# Patient Record
Sex: Male | Born: 1970 | Race: White | Hispanic: No | Smoking: Never smoker
Health system: Southern US, Community
[De-identification: ages and names within clinical notes are randomized; demographics above are authoritative.]

## PROBLEM LIST (undated history)

## (undated) DIAGNOSIS — G4700 Insomnia, unspecified: Secondary | ICD-10-CM

## (undated) DIAGNOSIS — R519 Headache, unspecified: Secondary | ICD-10-CM

## (undated) DIAGNOSIS — F419 Anxiety disorder, unspecified: Secondary | ICD-10-CM

## (undated) DIAGNOSIS — G4733 Obstructive sleep apnea (adult) (pediatric): Secondary | ICD-10-CM

## (undated) DIAGNOSIS — E781 Pure hyperglyceridemia: Secondary | ICD-10-CM

## (undated) DIAGNOSIS — J329 Chronic sinusitis, unspecified: Secondary | ICD-10-CM

## (undated) DIAGNOSIS — M545 Low back pain, unspecified: Secondary | ICD-10-CM

## (undated) DIAGNOSIS — R51 Headache: Secondary | ICD-10-CM

## (undated) DIAGNOSIS — F3181 Bipolar II disorder: Secondary | ICD-10-CM

## (undated) DIAGNOSIS — J301 Allergic rhinitis due to pollen: Secondary | ICD-10-CM

## (undated) DIAGNOSIS — G8929 Other chronic pain: Secondary | ICD-10-CM

## (undated) DIAGNOSIS — Z87438 Personal history of other diseases of male genital organs: Secondary | ICD-10-CM

## (undated) DIAGNOSIS — E66811 Obesity, class 1: Secondary | ICD-10-CM

## (undated) DIAGNOSIS — K219 Gastro-esophageal reflux disease without esophagitis: Secondary | ICD-10-CM

## (undated) DIAGNOSIS — E669 Obesity, unspecified: Secondary | ICD-10-CM

## (undated) HISTORY — DX: Low back pain: M54.5

## (undated) HISTORY — DX: Obesity, unspecified: E66.9

## (undated) HISTORY — DX: Insomnia, unspecified: G47.00

## (undated) HISTORY — DX: Anxiety disorder, unspecified: F41.9

## (undated) HISTORY — DX: Headache: R51

## (undated) HISTORY — DX: Obstructive sleep apnea (adult) (pediatric): G47.33

## (undated) HISTORY — DX: Other chronic pain: G89.29

## (undated) HISTORY — DX: Chronic sinusitis, unspecified: J32.9

## (undated) HISTORY — DX: Gastro-esophageal reflux disease without esophagitis: K21.9

## (undated) HISTORY — DX: Headache, unspecified: R51.9

## (undated) HISTORY — DX: Pure hyperglyceridemia: E78.1

## (undated) HISTORY — DX: Obesity, class 1: E66.811

## (undated) HISTORY — DX: Allergic rhinitis due to pollen: J30.1

## (undated) HISTORY — DX: Bipolar II disorder: F31.81

## (undated) HISTORY — DX: Personal history of other diseases of male genital organs: Z87.438

## (undated) HISTORY — DX: Low back pain, unspecified: M54.50

---

## 2008-03-15 HISTORY — PX: OTHER SURGICAL HISTORY: SHX169

## 2008-07-13 DIAGNOSIS — G4733 Obstructive sleep apnea (adult) (pediatric): Secondary | ICD-10-CM

## 2008-07-13 HISTORY — DX: Obstructive sleep apnea (adult) (pediatric): G47.33

## 2008-12-26 ENCOUNTER — Inpatient Hospital Stay (HOSPITAL_COMMUNITY): Admission: AD | Admit: 2008-12-26 | Discharge: 2008-12-27 | Payer: Self-pay | Admitting: Orthopedic Surgery

## 2009-01-28 ENCOUNTER — Encounter: Admission: RE | Admit: 2009-01-28 | Discharge: 2009-02-26 | Payer: Self-pay | Admitting: Orthopedic Surgery

## 2010-06-18 LAB — DIFFERENTIAL
Basophils Absolute: 0 10*3/uL (ref 0.0–0.1)
Basophils Relative: 0 % (ref 0–1)
Eosinophils Absolute: 0.1 10*3/uL (ref 0.0–0.7)
Monocytes Absolute: 0.8 10*3/uL (ref 0.1–1.0)
Monocytes Relative: 8 % (ref 3–12)

## 2010-06-18 LAB — URINALYSIS, ROUTINE W REFLEX MICROSCOPIC
Bilirubin Urine: NEGATIVE
Glucose, UA: NEGATIVE mg/dL
Hgb urine dipstick: NEGATIVE
Protein, ur: NEGATIVE mg/dL
Urobilinogen, UA: 0.2 mg/dL (ref 0.0–1.0)
pH: 5.5 (ref 5.0–8.0)

## 2010-06-18 LAB — CBC
Hemoglobin: 15 g/dL (ref 13.0–17.0)
MCV: 84.7 fL (ref 78.0–100.0)
RBC: 5.22 MIL/uL (ref 4.22–5.81)
WBC: 10.3 10*3/uL (ref 4.0–10.5)

## 2010-06-18 LAB — COMPREHENSIVE METABOLIC PANEL
AST: 20 U/L (ref 0–37)
Alkaline Phosphatase: 55 U/L (ref 39–117)
Chloride: 106 mEq/L (ref 96–112)
Potassium: 3.4 mEq/L — ABNORMAL LOW (ref 3.5–5.1)
Sodium: 139 mEq/L (ref 135–145)
Total Bilirubin: 0.5 mg/dL (ref 0.3–1.2)
Total Protein: 7.7 g/dL (ref 6.0–8.3)

## 2010-06-18 LAB — PROTIME-INR: INR: 1.03 (ref 0.00–1.49)

## 2010-06-18 LAB — TYPE AND SCREEN: Antibody Screen: NEGATIVE

## 2010-09-22 IMAGING — CR DG OR PORTABLE SPINE
1 series · 1 of 1 positions shown · non-contrast
Comparison: Intraoperative study from 1727 hours the same day,
preoperative exam 12/24/2008.

CLINICAL DATA: 38-year-old male undergoing lumbar surgery.

PORTABLE SPINE

[view not recorded]
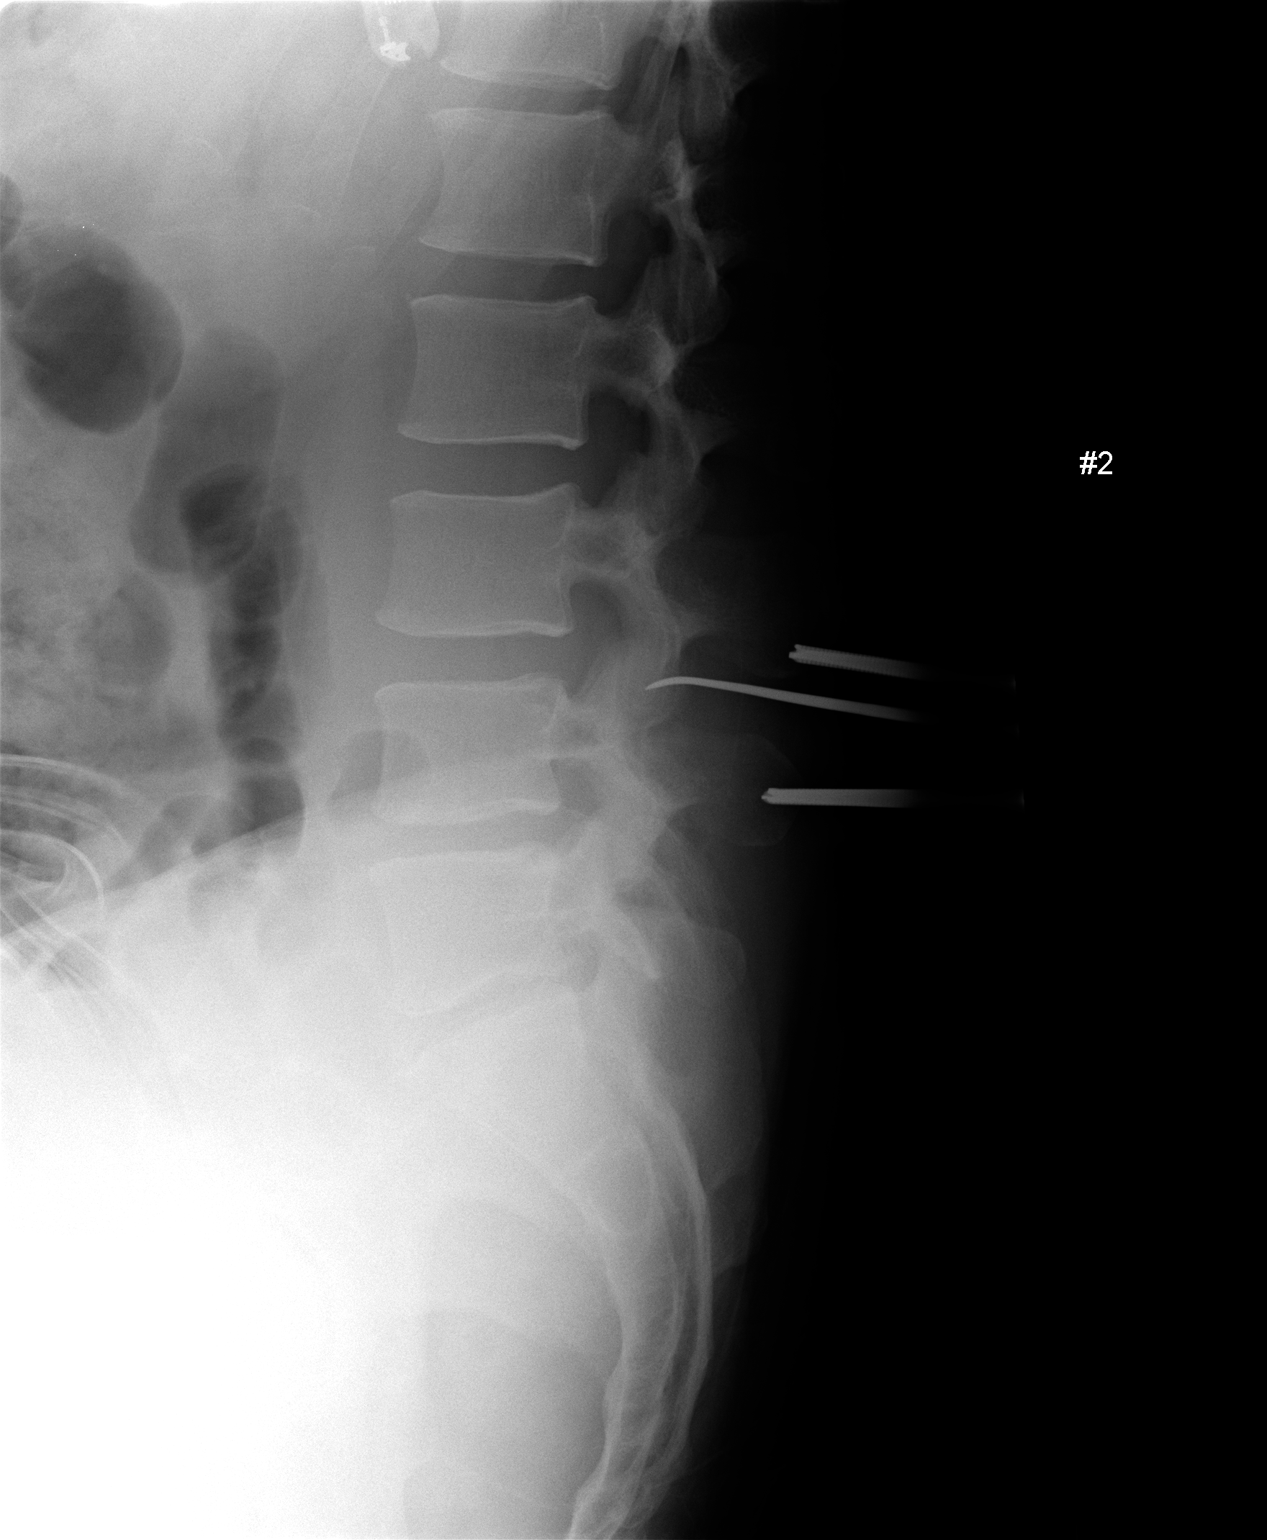

[1 of 1 positions shown; findings below may reference images not displayed]

FINDINGS: Portable cross-table lateral intraoperative view of the
lumbar spine labeled 5000 hours, #2.

Using the same numbering system as before (see report dated
12/24/2008), surgical probe is now present at the L3-L4 disc space
level, just above the L4 pedicle.  There are clamps on the L3 and
L4 spinous processes.
IMPRESSION: Intraoperative localization as above.  Transitional anatomy.
Correlation with appropriate cross sectional imaging is imperative.

## 2012-03-30 ENCOUNTER — Encounter: Payer: Self-pay | Admitting: Family Medicine

## 2012-03-30 ENCOUNTER — Ambulatory Visit (INDEPENDENT_AMBULATORY_CARE_PROVIDER_SITE_OTHER): Payer: BC Managed Care – PPO | Admitting: Family Medicine

## 2012-03-30 VITALS — BP 138/75 | HR 77 | Temp 98.8°F | Ht 71.0 in | Wt 213.0 lb

## 2012-03-30 DIAGNOSIS — G4700 Insomnia, unspecified: Secondary | ICD-10-CM

## 2012-03-30 MED ORDER — ZOLPIDEM TARTRATE 10 MG PO TABS: 10.0000 mg | ORAL_TABLET | Freq: Every evening | ORAL | Status: DC | PRN

## 2012-03-30 MED ORDER — TRAZODONE HCL 50 MG PO TABS: 25.0000 mg | ORAL_TABLET | Freq: Every evening | ORAL | Status: DC | PRN

## 2012-03-30 NOTE — Progress Notes (Signed)
Office Note 04/06/2012  CC:  Chief Complaint  Patient presents with  . Establish Care    hx sleep apnea; anxiety/depression under a lot of stress; sleep deprived for a decade; main issue is unable to fall asleep and stay asleep    HPI:  Alex Hall is a 42 y.o. White male who is here to establish care and discuss sleeping issues. Patient's most recent primary MD: last saw PMD at [facility] in [place] about 6 yrs ago. Old records were not reviewed prior to or during today's visit.  He describes a long history of anxiety and depression, insomnia, fatigue, sinus problems/pND, GERD w/esophagitis and also gastritis/duodenitis.  Has seen several specialists in the fields of ENT, allergy/immunology, psychiatry, and GI.  Today he wanted to discuss his insomnia, which he basically describes as intractable and resistant to any medication used in the past, including atypical antipsychotics.  He says he has not tried any rx med for it in about 5 yrs.  He adds that at first he did respond to zolpidem 10mg  but he eventually did not respond to it and d/c'd it, went on to try many others w/out success.  Past Medical History  Diagnosis Date  . Anxiety and depression   . Insomnia   . Chronic low back pain     with chronic radiculopathy pain  . OSA (obstructive sleep apnea)     Hx of intolerance to CPAP sedondary to anxiety/max intolerance  . Hay fever   . Chronic headaches   . Chronic sinusitis   . GERD (gastroesophageal reflux disease)     with schatski's ring    Past Surgical History  Procedure Date  . Lumbar back surg 2010    Family History  Problem Relation Age of Onset  . CVA Father   . Arthritis-Osteo Mother     History   Social History  . Marital Status: Married    Spouse Name: N/A    Number of Children: N/A  . Years of Education: N/A   Occupational History  . Not on file.   Social History Main Topics  . Smoking status: Never Smoker   . Smokeless tobacco: Never Used    . Alcohol Use: No  . Drug Use: No  . Sexually Active: Not on file   Other Topics Concern  . Not on file   Social History Narrative   Divorced, remarried.Works as an Leisure centre manager).College educated.Normal american diet.  No exercise.    Outpatient Encounter Prescriptions as of 03/30/2012  Medication Sig Dispense Refill  . aspirin-acetaminophen-caffeine (EXCEDRIN MIGRAINE) 250-250-65 MG per tablet Take 1 tablet by mouth every 6 (six) hours as needed.      . Naproxen Sodium (ALEVE) 220 MG CAPS Take 1 capsule by mouth as needed.      . traZODone (DESYREL) 50 MG tablet Take 0.5-1 tablets (25-50 mg total) by mouth at bedtime as needed for sleep.  30 tablet  1  . zolpidem (AMBIEN) 10 MG tablet Take 1 tablet (10 mg total) by mouth at bedtime as needed for sleep.  15 tablet  1    No Known Allergies  ROS Review of Systems  Constitutional: Positive for fatigue.  HENT: Positive for postnasal drip.   Cardiovascular: Chest pain: occasional.  Gastrointestinal: Negative for abdominal distention and anal bleeding.  Genitourinary: Negative for urgency and frequency.  Musculoskeletal: Positive for back pain.  Skin: Negative for rash.  Neurological: Negative for seizures.  Psychiatric/Behavioral: Positive for sleep  disturbance and dysphoric mood. Negative for suicidal ideas. The patient is nervous/anxious.     PE; Blood pressure 138/75, pulse 77, temperature 98.8 F (37.1 C), temperature source Temporal, height 5\' 11"  (1.803 m), weight 213 lb (96.616 kg), SpO2 97.00%. Gen: Alert, well appearing.  Patient is oriented to person, place, time, and situation. ENT: Ears: EACs clear, normal epithelium.  TMs with good light reflex and landmarks bilaterally.  Eyes: no injection, icteris, swelling, or exudate.  EOMI, PERRLA. Nose: no drainage or turbinate edema/swelling.  No injection or focal lesion.  Mouth: lips without lesion/swelling.  Oral mucosa pink  and moist.  Dentition intact and without obvious caries or gingival swelling.  Oropharynx without erythema, exudate, or swelling.  Neck - No masses or thyromegaly or limitation in range of motion CV: RRR, no m/r/g.   LUNGS: CTA bilat, nonlabored resps, good aeration in all lung fields. EXT: no clubbing, cyanosis, or edema.   Pertinent labs:  none  ASSESSMENT AND PLAN:   New pt: obtain old records (extensive!)  Insomnia Trial of ambien 10mg  again since he has done well on this in the past. Add trazodone 50mg  in 2d if no signif improvement.  Increase to 100mg  qhs after 3d if needed. Therapeutic expectations and side effect profile of medication discussed today.  Patient's questions answered.   An After Visit Summary was printed and given to the patient.  Spent 35 min with pt today, with >50% of that time spent in counseling and care coordination.  Return in about 1 week (around 04/06/2012) for morning appt-30 min spot--needs to be fasting.

## 2012-04-04 ENCOUNTER — Encounter: Payer: Self-pay | Admitting: Family Medicine

## 2012-04-04 DIAGNOSIS — G4700 Insomnia, unspecified: Secondary | ICD-10-CM | POA: Insufficient documentation

## 2012-04-04 NOTE — Assessment & Plan Note (Signed)
Trial of ambien 10mg  again since he has done well on this in the past. Add trazodone 50mg  in 2d if no signif improvement.  Increase to 100mg  qhs after 3d if needed. Therapeutic expectations and side effect profile of medication discussed today.  Patient's questions answered.

## 2012-04-06 ENCOUNTER — Encounter: Payer: Self-pay | Admitting: Family Medicine

## 2012-04-06 ENCOUNTER — Ambulatory Visit (INDEPENDENT_AMBULATORY_CARE_PROVIDER_SITE_OTHER): Payer: BC Managed Care – PPO | Admitting: Family Medicine

## 2012-04-06 VITALS — BP 132/78 | HR 82 | Temp 98.6°F | Ht 71.0 in | Wt 211.0 lb

## 2012-04-06 DIAGNOSIS — G4700 Insomnia, unspecified: Secondary | ICD-10-CM

## 2012-04-06 DIAGNOSIS — K2970 Gastritis, unspecified, without bleeding: Secondary | ICD-10-CM

## 2012-04-06 DIAGNOSIS — R635 Abnormal weight gain: Secondary | ICD-10-CM

## 2012-04-06 DIAGNOSIS — G4733 Obstructive sleep apnea (adult) (pediatric): Secondary | ICD-10-CM

## 2012-04-06 DIAGNOSIS — K2990 Gastroduodenitis, unspecified, without bleeding: Secondary | ICD-10-CM

## 2012-04-06 DIAGNOSIS — K2980 Duodenitis without bleeding: Secondary | ICD-10-CM

## 2012-04-06 LAB — CBC WITH DIFFERENTIAL/PLATELET
Basophils Absolute: 0 10*3/uL (ref 0.0–0.1)
Basophils Relative: 0.4 % (ref 0.0–3.0)
HCT: 44.3 % (ref 39.0–52.0)
Hemoglobin: 15 g/dL (ref 13.0–17.0)
Monocytes Absolute: 0.4 10*3/uL (ref 0.1–1.0)
Monocytes Relative: 6.3 % (ref 3.0–12.0)
Platelets: 243 10*3/uL (ref 150.0–400.0)
RBC: 5.3 Mil/uL (ref 4.22–5.81)
RDW: 14.3 % (ref 11.5–14.6)
WBC: 6.4 10*3/uL (ref 4.5–10.5)

## 2012-04-06 LAB — COMPREHENSIVE METABOLIC PANEL
ALT: 27 U/L (ref 0–53)
AST: 24 U/L (ref 0–37)
CO2: 28 mEq/L (ref 19–32)
Chloride: 103 mEq/L (ref 96–112)
Sodium: 138 mEq/L (ref 135–145)
Total Protein: 8.4 g/dL — ABNORMAL HIGH (ref 6.0–8.3)

## 2012-04-06 LAB — TSH: TSH: 1.45 u[IU]/mL (ref 0.35–5.50)

## 2012-04-06 LAB — SEDIMENTATION RATE: Sed Rate: 1 mm/hr (ref 0–22)

## 2012-04-06 LAB — LIPID PANEL
LDL Cholesterol: 82 mg/dL (ref 0–99)
Total CHOL/HDL Ratio: 4

## 2012-04-06 MED ORDER — AZELASTINE-FLUTICASONE 137-50 MCG/ACT NA SUSP: 1.0000 | Freq: Two times a day (BID) | NASAL | Status: DC

## 2012-04-06 MED ORDER — ALUMINUM CHLORIDE 20 % EX SOLN: Freq: Every day | CUTANEOUS | Status: DC

## 2012-04-06 NOTE — Assessment & Plan Note (Signed)
Minimal improvement. Continue to titrate trazodone up q4d to max of 250mg  qhs.  Continue zolpidem 10mg  qhs. His anxiety, which is untreated at this time, is having a huge impact on sleep.  Also, his OSA is likely inhibiting restful sleep. Will ask sleep med MD to see him to discuss his troubles with OSA and its treatments.

## 2012-04-06 NOTE — Progress Notes (Signed)
OFFICE NOTE  04/06/2012  CC:  Chief Complaint  Patient presents with  . Follow-up    insomnia: meds "kind of" helping; also needs fasting labs     HPI: Patient is a 42 y.o. Caucasian male who is here for 1 wk f/u insomnia. States mild improvement in sleep initiation initially, but has still had nights in which he got "1 hour" of sleep. He has added a trazodone 50mg  tab to his zolpidem.  He still says his mind races, anxiety plays havoc with him all day and night, also says he thinks his OSA is playing a big role.  Also says his post-nasal drip is constant and bothersome all night--currently not taking anything for this but is interested in retrying something. We discussed his past experience with allergy eval: two evaluations turned up several significant environmental allergens (here in [place]) and he was on oral drop immunotherapy for allergies for about 6-8 mo but d/c'd this due to lack of effect and high cost out of pocket.  Standard allergy medications were ineffective or intolerable.  So, essentially he is minimally changed but this is about what we expected given his past history, and we are both mildly pleased that he has at least tolerated the zolpidem and trazodone.  He asks about a med to help with his chronic severe axillary sweating related to his chronic anxiety. He still has plans to approach treatment for his anxiety and mood disorder from a non-medication standpoint since he has had NO success with any psych meds in the past.  He currently sees a psychologist.   Pertinent PMH:  Past Medical History  Diagnosis Date  . Anxiety and depression   . Insomnia   . Chronic low back pain     with chronic radiculopathy pain  . OSA (obstructive sleep apnea)     Hx of intolerance to CPAP sedondary to anxiety/max intolerance  . Hay fever   . Chronic headaches   . Chronic sinusitis   . GERD (gastroesophageal reflux disease)     with schatski's ring   Past surgical, social, and  family history reviewed and no changes noted since last office visit.  MEDS:  Outpatient Prescriptions Prior to Visit  Medication Sig Dispense Refill  . aspirin-acetaminophen-caffeine (EXCEDRIN MIGRAINE) 250-250-65 MG per tablet Take 1 tablet by mouth every 6 (six) hours as needed.      . Naproxen Sodium (ALEVE) 220 MG CAPS Take 1 capsule by mouth as needed.      . traZODone (DESYREL) 50 MG tablet Take 0.5-1 tablets (25-50 mg total) by mouth at bedtime as needed for sleep.  30 tablet  1  . zolpidem (AMBIEN) 10 MG tablet Take 1 tablet (10 mg total) by mouth at bedtime as needed for sleep.  15 tablet  1  Last reviewed on 04/06/2012  9:58 AM by Jeoffrey Massed, MD  PE: Blood pressure 132/78, pulse 82, temperature 98.6 F (37 C), temperature source Temporal, height 5\' 11"  (1.803 m), weight 211 lb (95.709 kg). Gen: Alert, well appearing.  Patient is oriented to person, place, time, and situation. AFFECT: pleasant, lucid thought and speech. No further exam today.  IMPRESSION AND PLAN: Insomnia Minimal improvement. Continue to titrate trazodone up q4d to max of 250mg  qhs.  Continue zolpidem 10mg  qhs. His anxiety, which is untreated at this time, is having a huge impact on sleep.  Also, his OSA is likely inhibiting restful sleep. Will ask sleep med MD to see him to discuss his troubles  with OSA and its treatments.     2) Excessive perspiration secondary to chronic high anxiety state: Drysol rx'd today.  Therapeutic expectations and side effect profile of medication discussed today.  Patient's questions answered.  3) Chronic post-nasal drip: trial of dymista rx'd.  Spent 25 min with pt today, with >50% of this time spent in counseling and care coordination for the above problems.  FOLLOW UP: 1 week

## 2012-04-09 LAB — GASTRIN: Gastrin: 22 pg/mL (ref <101)

## 2012-04-13 ENCOUNTER — Encounter: Payer: Self-pay | Admitting: Family Medicine

## 2012-04-13 ENCOUNTER — Ambulatory Visit (INDEPENDENT_AMBULATORY_CARE_PROVIDER_SITE_OTHER): Payer: BC Managed Care – PPO | Admitting: Family Medicine

## 2012-04-13 ENCOUNTER — Ambulatory Visit: Payer: BC Managed Care – PPO | Admitting: Family Medicine

## 2012-04-13 VITALS — BP 120/77 | HR 67 | Temp 99.0°F | Ht 71.0 in | Wt 211.0 lb

## 2012-04-13 DIAGNOSIS — G43009 Migraine without aura, not intractable, without status migrainosus: Secondary | ICD-10-CM

## 2012-04-13 DIAGNOSIS — G4700 Insomnia, unspecified: Secondary | ICD-10-CM

## 2012-04-13 MED ORDER — GABAPENTIN ENACARBIL ER 600 MG PO TBCR: 600.0000 mg | EXTENDED_RELEASE_TABLET | Freq: Every day | ORAL | Status: DC

## 2012-04-13 MED ORDER — RIZATRIPTAN BENZOATE 10 MG PO TABS: 10.0000 mg | ORAL_TABLET | ORAL | Status: DC | PRN

## 2012-04-13 NOTE — Assessment & Plan Note (Signed)
Continue zolpidem 10mg  qhs and trazodone 50mg  qhs. After we get his HA to resolve, I want him to start a trial of Horizant.  I think possibly his "nerve pain" is a condition similar to RLS that could be alleviated by this med.  He'll start at the recommended 600mg  dose at 5pm daily.  Therapeutic expectations and side effect profile of medication discussed today.  Patient's questions answered.

## 2012-04-13 NOTE — Assessment & Plan Note (Signed)
Maxalt 10mg  trial.  Therapeutic expectations and side effect profile of medication discussed today.  Patient's questions answered. I think in this case a fallback med in case this doesn't help is narcotic pain med such as tylox.  He reports having tolerated this type of med in the distant past when it was used for post-op back surgery.

## 2012-04-13 NOTE — Progress Notes (Signed)
OFFICE NOTE  04/13/2012  CC:  Chief Complaint  Patient presents with  . Follow-up    multiple issues     HPI: Patient is a 42 y.o. Caucasian male who is here for 1 wk f/u insomnia. Ambien 10mg  + trazodone 2 tabs was helping but in the same sentence patient describes nights of "no sleep" or "horrible sleep".  On one instance his wife got frightened b/c he seemed to be in a state in between wakefulness and sleep (at night, when trying to sleep).  Since this scared her, he has gone back to 1 trazodone and [place]. He definitely is still very anxious, is in the process of trying to get a less stressful position in his current company. When he tries to go to sleep, he definitely has racing nervous thoughts, also has constant "nerve pain" in his legs that he says bothers him but "I've grown used to it".  Denies restless legs/irresistable urge to move legs.  Basically feels sharp/shooting pains in all aspects of his legs.  Says he has had these pains ever since his low back surgery, which he describes as a horrible experience.  His most pressing problem today is a migraine HA for the last 4d.  Intensity is 6-7/10 and located in retroorbital and forehead area, +photophobia, nausea without vomiting.  He has 2-3 of these type of migraines per month, usually catches it early with an excedrin migraine and this helps significantly.  This one has not responded.  Even after he gets a couple hours of sleep the HA is still there.  No blurry vision or visual field deficit.  No fever, no neck stiffness, no focal weakness, no speech problems. No hx of ever having taken a triptan.       Pertinent PMH:  Past Medical History  Diagnosis Date  . Anxiety and depression   . Insomnia   . Chronic low back pain     with chronic radiculopathy pain  . OSA (obstructive sleep apnea)     Hx of intolerance to CPAP sedondary to anxiety/max intolerance  . Hay fever   . Chronic headaches   . Chronic sinusitis   .  GERD (gastroesophageal reflux disease)     with schatski's ring    MEDS:  Outpatient Prescriptions Prior to Visit  Medication Sig Dispense Refill  . aluminum chloride (DRYSOL) 20 % external solution Apply topically at bedtime.  60 mL  3  . aspirin-acetaminophen-caffeine (EXCEDRIN MIGRAINE) 250-250-65 MG per tablet Take 1 tablet by mouth every 6 (six) hours as needed.      . Naproxen Sodium (ALEVE) 220 MG CAPS Take 1 capsule by mouth as needed.      . traZODone (DESYREL) 50 MG tablet Take 0.5-1 tablets (25-50 mg total) by mouth at bedtime as needed for sleep.  30 tablet  1  . zolpidem (AMBIEN) 10 MG tablet Take 1 tablet (10 mg total) by mouth at bedtime as needed for sleep.  15 tablet  1  . Azelastine-Fluticasone (DYMISTA) 137-50 MCG/ACT SUSP Place 1 spray into the nose 2 (two) times daily.  23 g  3   Last reviewed on 04/13/2012  3:45 PM by Jeoffrey Massed, MD  PE: Blood pressure 120/77, pulse 67, temperature 99 F (37.2 C), temperature source Temporal, height 5\' 11"  (1.803 m), weight 211 lb (95.709 kg), SpO2 97.00%. Gen: Alert, well appearing.  Patient is oriented to person, place, time, and situation. AFFECT: pleasant, lucid thought and speech, slightly anxious. Neuro:  CN 2-12 intact bilaterally, strength 5/5 in proximal and distal upper extremities and lower extremities bilaterally.  No sensory deficits.  No tremor.  No disdiadochokinesis.  No ataxia.  Upper extremity and lower extremity DTRs symmetric.  No pronator drift.  IMPRESSION AND PLAN:  Insomnia Continue zolpidem 10mg  qhs and trazodone 50mg  qhs. After we get his HA to resolve, I want him to start a trial of Horizant.  I think possibly his "nerve pain" is a condition similar to RLS that could be alleviated by this med.  He'll start at the recommended 600mg  dose at 5pm daily.  Therapeutic expectations and side effect profile of medication discussed today.  Patient's questions answered.   Migraine headache without aura Maxalt  10mg  trial.  Therapeutic expectations and side effect profile of medication discussed today.  Patient's questions answered. I think in this case a fallback med in case this doesn't help is narcotic pain med such as tylox.  He reports having tolerated this type of med in the distant past when it was used for post-op back surgery.   I did bring up the subject of his past problems with chronic throat awareness and OSA.  In review of his ENT's notes from [facility] (Dr. Christell Constant), I told Jesusita Oka today that I think he should follow up with this MD to reopen the conversation about possible surgical intervention (tonsillectomy, palate surgery, etc).  An After Visit Summary was printed and given to the patient.  FOLLOW UP: 2 wks

## 2012-04-20 ENCOUNTER — Institutional Professional Consult (permissible substitution): Payer: BC Managed Care – PPO | Admitting: Pulmonary Disease

## 2012-04-27 ENCOUNTER — Other Ambulatory Visit: Payer: Self-pay | Admitting: Family Medicine

## 2012-04-28 ENCOUNTER — Ambulatory Visit: Payer: BC Managed Care – PPO | Admitting: Family Medicine

## 2012-05-01 NOTE — Telephone Encounter (Signed)
eScribe request for refill on ZOLPIDEM Last filled - 03/30/12, #15 X 1 Last seen on - 04/13/12 Follow up - 04/28/12--APPT canceled due to office closed (snow). Please advise refills.

## 2012-05-03 NOTE — Telephone Encounter (Signed)
RX faxed

## 2012-05-04 ENCOUNTER — Telehealth: Payer: Self-pay | Admitting: Family Medicine

## 2012-05-04 NOTE — Telephone Encounter (Signed)
Let me know when you have contacted Alex Hall.

## 2012-05-04 NOTE — Telephone Encounter (Signed)
29 minutes spent with insurance company, 3 transfers to different departments.  Dymista paperwork not received as [facility] is not doing there own PA's right now and [facility] did not get paperwork.  Dymista approved via phone. Zolpidem also approved via phone. John at [facility] was notified.  Herbert Seta has been notified that medications have been approved, but I do not know what copay will be.  She is thankful for our help.

## 2012-05-09 ENCOUNTER — Ambulatory Visit (INDEPENDENT_AMBULATORY_CARE_PROVIDER_SITE_OTHER): Payer: BC Managed Care – PPO | Admitting: Pulmonary Disease

## 2012-05-09 ENCOUNTER — Encounter: Payer: Self-pay | Admitting: Pulmonary Disease

## 2012-05-09 VITALS — BP 130/90 | HR 76 | Temp 98.4°F | Ht 71.0 in | Wt 217.0 lb

## 2012-05-09 DIAGNOSIS — G4733 Obstructive sleep apnea (adult) (pediatric): Secondary | ICD-10-CM

## 2012-05-09 DIAGNOSIS — G4700 Insomnia, unspecified: Secondary | ICD-10-CM

## 2012-05-09 NOTE — Patient Instructions (Addendum)
Degree of obstructive sleep apnea is mild -moderate Weight loss We can refer you to a dentist for oral appliance - Dr Myrtis Ser Treatment of insomnia as you are doing

## 2012-05-09 NOTE — Progress Notes (Signed)
Subjective:    Patient ID: Alex Hall, male    DOB: [birth year], 42 y.o.   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 He has long-standing anxiety and depression for 20 years and has seen multiple psychiatrists and undergone cognitive behavior therapy. He also suffers from chronic pain in his back after surgery. He reports sleep onset insomnia for at least last 10 years and has been started on Ambien and trazodone for the last 2-3 weeks. Obstructive sleep apnea was diagnosed no in 5/10, baseline polysomnogram showed total sleep time of 5 hours 48 minutes with AHI of 12 events per hour, predominant hypopneas, with an RDI of 21 events per hour and lowest desaturation of 87%. CPAP was titrated to 6 cm to correct these events .however he was unable to tolerate CPAP.  Epworth sleepiness score is 2/24. Bedtime is around 11 PM to midnight, sleep latency can be one to 2 hours. He attributes this to racing thoughts and anxiety symptoms. He sleeps on his right side with 2 pillows due to his chronic pain. He is 2-3 awakenings on the hour and is out of bed by 8 AM feeling miserable no but denies headaches or dryness of mouth. He admits to being a mouth breather. He is gained about 10 pounds in the last 2 years and denies excessive consumption of caffeinated beverages. There is no history suggestive of cataplexy, sleep paralysis or parasomnias   Past Medical History  Diagnosis Date  . Anxiety and depression   . Insomnia   . Chronic low back pain     with chronic radiculopathy pain  . OSA (obstructive sleep apnea)     Hx of intolerance to CPAP sedondary to anxiety/max intolerance  . Hay fever   . Chronic headaches   . Chronic sinusitis   . GERD (gastroesophageal reflux disease)     with schatski's ring    Past Surgical History  Procedure Laterality Date  . Lumbar back surg  2010    Allergies  Allergen Reactions   . Effexor (Venlafaxine Hcl) Other (See Comments)    "bad side effects"  . Risperdal (Risperidone) Other (See Comments)    "ineffective + "side effects"  . Stelazine (Trifluoperazine) Other (See Comments)    "terrible, awful side effects"  . Zyprexa (Olanzapine) Other (See Comments)    "terrible, awful side effects"    History   Social History  . Marital Status: Married    Spouse Name: N/A    Number of Children: N/A  . Years of Education: N/A   Occupational History  . Acupuncturist    Social History Main Topics  . Smoking status: Never Smoker   . Smokeless tobacco: Never Used  . Alcohol Use: No  . Drug Use: No  . Sexually Active: Not on file   Other Topics Concern  . Not on file   Social History Narrative   Divorced, remarried.   Works as an Leisure centre manager).   College educated.   Normal american diet.  No exercise.      Review of Systems  Constitutional: Negative for fever and unexpected weight change.  HENT: Negative for ear pain, nosebleeds, congestion, sore throat, rhinorrhea, sneezing, trouble swallowing, dental problem, postnasal drip and sinus pressure.   Eyes: Negative for redness and itching.  Respiratory: Negative for cough, chest tightness, shortness of breath and wheezing.   Cardiovascular: Negative for palpitations and leg swelling.  Gastrointestinal:  Negative for nausea and vomiting.  Genitourinary: Negative for dysuria.  Musculoskeletal: Negative for joint swelling.  Skin: Negative for rash.  Neurological: Positive for headaches.  Hematological: Does not bruise/bleed easily.  Psychiatric/Behavioral: Positive for dysphoric mood. The patient is nervous/anxious.        Objective:   Physical Exam  Gen. Pleasant, well-nourished, in no distress, normal affect ENT - no lesions, no post nasal drip Neck: No JVD, no thyromegaly, no carotid bruits Lungs: no use of accessory muscles, no dullness  to percussion, clear without rales or rhonchi  Cardiovascular: Rhythm regular, heart sounds  normal, no murmurs or gallops, no peripheral edema Abdomen: soft and non-tender, no hepatosplenomegaly, BS normal. Musculoskeletal: No deformities, no cyanosis or clubbing Neuro:  alert, non focal       Assessment & Plan:

## 2012-05-09 NOTE — Assessment & Plan Note (Addendum)
Degree of obstructive sleep apnea is mild -moderate -He did not tolerate CPAP at all  Weight loss 10 lbs We can refer you to a dentist for oral appliance - Dr Myrtis Ser

## 2012-05-11 NOTE — Assessment & Plan Note (Signed)
Would suggest addition of melatonin 5 mg Trazodone is at a small dose and can be increased further if needed. Would prefer single agent therapy no.

## 2012-05-18 ENCOUNTER — Encounter: Payer: Self-pay | Admitting: Family Medicine

## 2012-05-25 ENCOUNTER — Encounter: Payer: Self-pay | Admitting: Pulmonary Disease

## 2012-06-17 ENCOUNTER — Other Ambulatory Visit: Payer: Self-pay | Admitting: Family Medicine

## 2012-06-20 NOTE — Telephone Encounter (Signed)
eScribe request for refill on TRAZODONE Last filled - 03/30/12, #30 X 1 (1/2 - 1 QHS) Last seen on - 04/13/12 Follow up - 2 WEEKS (cancelled due to snow) Please advise refills.

## 2012-06-27 ENCOUNTER — Telehealth: Payer: Self-pay | Admitting: Family Medicine

## 2012-06-27 MED ORDER — ZOLPIDEM TARTRATE 10 MG PO TABS: ORAL_TABLET | ORAL | Status: DC

## 2012-06-27 NOTE — Telephone Encounter (Signed)
Refill request for Ambien 10 mg Last filled- 05/03/12 #30 1/refill Last seen- 04/13/12  Follow up - 2 weeks  Please advise refills.

## 2012-06-27 NOTE — Telephone Encounter (Signed)
Prescription faxed to pharmacy.

## 2012-06-27 NOTE — Telephone Encounter (Signed)
OK to rf as previously prescribed, with 5 additional RFs.-thx

## 2012-08-01 ENCOUNTER — Telehealth: Payer: Self-pay | Admitting: Family Medicine

## 2012-08-01 MED ORDER — CLONAZEPAM 1 MG PO TABS: ORAL_TABLET | ORAL | Status: DC

## 2012-08-01 NOTE — Telephone Encounter (Signed)
Talked to Dr. Ledon Snare, clinical neuropsychologist in [place], who has been seeing pt lately. He is referring pt to neurobiofeedback and he also suggests a trial of clonazepam (this is one of the few psych meds pt has not been on, and it can be very helpful in severe anxiety if dose is pushed some--even in people like this pt who have not responded to other benzodiazepines--per Dr. Dewitt Hoes input today). I agreed to start this rx.  Dr. Ledon Snare assured me that he'll be following Alex Hall closely in his office and will send me periodic office notes.  Jasmine December: will you pls fax the clonazepam rx I printed to pt's pharmacy ([facility]).-thx

## 2012-08-01 NOTE — Telephone Encounter (Signed)
Rx faxed/[facility]

## 2012-08-29 ENCOUNTER — Telehealth: Payer: Self-pay | Admitting: Family Medicine

## 2012-08-29 MED ORDER — LAMOTRIGINE 100 MG PO TABS: ORAL_TABLET | ORAL | Status: DC

## 2012-08-29 NOTE — Telephone Encounter (Signed)
Patients wife aware, she will have him pick up medication and make an appointment to f/u with Dr Milinda Cave in a month.

## 2012-08-29 NOTE — Telephone Encounter (Signed)
Lisa: please call pt and tell him I have eRx'd lamictal for him, as per the conversation I had with his psychologist noted below.  Tell him I would like him to f/u with me in office in 4-5 wks.-thx  Received call from pt's psychologist today, Dr. Ledon Snare. Due to pt's recent paradoxic response to klonopin trial recently (and with help of hx from pt's wife), Dr. Ledon Snare now believes the pt's dx is bipolar II, and that most of his anxiety sx's are actually hypomanic sx's.  He recommended starting lamictal 50mg  qd and titrating up to 200mg  qd.  If depressive sx's not improved on this over the next 4-6 wks, will add fluoxetine 20mg  daily, goal of 40mg  daily dosing. Will eRx lamictal and have pt come in for o/v in about 4-5 wks.

## 2012-08-30 ENCOUNTER — Telehealth: Payer: Self-pay | Admitting: Family Medicine

## 2012-08-30 MED ORDER — TRAZODONE HCL 50 MG PO TABS: ORAL_TABLET | ORAL | Status: DC

## 2012-08-30 MED ORDER — ZOLPIDEM TARTRATE 10 MG PO TABS: ORAL_TABLET | ORAL | Status: DC

## 2012-08-30 NOTE — Telephone Encounter (Signed)
Caller informed, understood & states she will update counselor on providers recommendation on fluoxetine; Rx scripts faxed to pharmacy/[facility]

## 2012-08-30 NOTE — Telephone Encounter (Signed)
Patient met with counselor. Counselor recommended patient take Prozac with the Lamictal. Please advise.

## 2012-08-30 NOTE — Telephone Encounter (Signed)
Please advise new medication and refills.

## 2012-08-30 NOTE — Telephone Encounter (Signed)
Ambien and trazodone may be RF'd as previously prescribed with 5 additional RFs each. Regarding the new meds, I have already sent eRx for his lamictal and I am not starting fluoxetine/prozac until after he has been on lamictal for at least a month.-thx

## 2012-09-05 ENCOUNTER — Other Ambulatory Visit: Payer: Self-pay | Admitting: Family Medicine

## 2012-09-27 ENCOUNTER — Encounter: Payer: Self-pay | Admitting: Family Medicine

## 2012-09-27 ENCOUNTER — Ambulatory Visit (INDEPENDENT_AMBULATORY_CARE_PROVIDER_SITE_OTHER): Payer: BC Managed Care – PPO | Admitting: Family Medicine

## 2012-09-27 VITALS — BP 127/74 | HR 60 | Temp 98.1°F | Resp 16 | Ht 71.0 in | Wt 201.0 lb

## 2012-09-27 DIAGNOSIS — F3189 Other bipolar disorder: Secondary | ICD-10-CM

## 2012-09-27 DIAGNOSIS — G4700 Insomnia, unspecified: Secondary | ICD-10-CM

## 2012-09-27 DIAGNOSIS — F3181 Bipolar II disorder: Secondary | ICD-10-CM

## 2012-09-27 MED ORDER — TRAZODONE HCL 50 MG PO TABS: ORAL_TABLET | ORAL | Status: DC

## 2012-09-27 MED ORDER — AZELASTINE-FLUTICASONE 137-50 MCG/ACT NA SUSP: 2.0000 | Freq: Two times a day (BID) | NASAL | Status: DC

## 2012-09-27 MED ORDER — LAMOTRIGINE 200 MG PO TABS: 200.0000 mg | ORAL_TABLET | Freq: Every day | ORAL | Status: DC

## 2012-09-27 MED ORDER — ZOLPIDEM TARTRATE 10 MG PO TABS: ORAL_TABLET | ORAL | Status: DC

## 2012-09-27 NOTE — Assessment & Plan Note (Addendum)
Tolerating lamictal 150mg  qd but not feeling any improvement in psychiatric state yet. Plan is to continue the 150mg  qd dosing 1 more week and then increase to 200mg  once daily at that time. The plan had been to add fluoxetine to this med once we felt like some mood stabilization has occurred--we're obviously not at that point yet. He is happy that he has at least tolerated the lamictal w/out side effect and he is optimistic about his chances of improvement on this med in the near future. Reviewed side effect profile of lamictal with pt again--particulary what to do if he develops a rash.

## 2012-09-27 NOTE — Patient Instructions (Signed)
Take 150mg  lamictal daily for 1 more week, then increase to 200mg  daily (new rx sent in). Change ambien to 1/2 of 10mg  tab nightly and increase trazodone to 100mg  at bedtime.

## 2012-09-27 NOTE — Assessment & Plan Note (Signed)
Will increase trazodone to 100mg  qhs and decrease ambien to 1/2 of a 10mg  tab qhs. Side effects of trazodone discussed.

## 2012-09-27 NOTE — Progress Notes (Signed)
OFFICE NOTE  09/27/2012  CC:  Chief Complaint  Patient presents with  . Follow-up  . Insomnia     HPI: Patient is a 42 y.o. Caucasian male who is here for f/u psych med regimen. Started lamictal per recommendations of his psychologist and he has made it up to the 150mg  dosing for at about 1 wk now (dx of bipolar II disorder based on past response to antidepressants and other psychotropic meds, but most impressively from his manic reaction to taking clonazepam a couple of months ago. He denies any rash or any other side effect from the med, which he is happy about.  However, he notes no change in his level of irritability, racing thoughts, moodiness, sleep dysfunction, and anxiety. He is definitely willing to continue the med and increase to max dose of 200mg  soon and give things more time.  Regarding insomnia, he gets only fair sleep on 10mg  ambien and 50mg  trazodone qhs, says he doesn't like being reliant on [place] b/c it is a controlled substance. He asks about possibly increasing the trazodone (denies side effects) and decreasing the ambien.  He reports that he has a new job which is less stressful. He says the Drysol has helped with his hyperhydration and dymista has helped with his rhinitis/PND.  Pertinent PMH:  Past Medical History  Diagnosis Date  . Anxiety and depression   . Insomnia   . Chronic low back pain     with chronic radiculopathy pain  . OSA (obstructive sleep apnea) 07/2008    Hx of intolerance to CPAP sedondary to anxiety/max intolerance  . Hay fever   . Chronic headaches   . Chronic sinusitis   . GERD (gastroesophageal reflux disease)     with schatski's ring    MEDS:  Outpatient Prescriptions Prior to Visit  Medication Sig Dispense Refill  . aluminum chloride (DRYSOL) 20 % external solution Apply topically at bedtime.  60 mL  3  . aspirin-acetaminophen-caffeine (EXCEDRIN MIGRAINE) 250-250-65 MG per tablet Take 1 tablet by mouth every 6 (six) hours as  needed.      . Azelastine-Fluticasone (DYMISTA) 137-50 MCG/ACT SUSP Place 1 spray into the nose 2 (two) times daily.  23 g  3  . cetirizine (ZYRTEC) 10 MG tablet Take 10 mg by mouth daily.      Marland Kitchen lamoTRIgine (LAMICTAL) 100 MG tablet 1/2 tab po qd x 2 wks, then increase to 1 whole tab po qd x 2 wks, then increase to 1 and 1/2 tabs po qd  30 tablet  1  . Naproxen Sodium (ALEVE) 220 MG CAPS Take 1 capsule by mouth as needed.      . rizatriptan (MAXALT) 10 MG tablet Take 1 tablet (10 mg total) by mouth as needed for migraine. May repeat in 2 hours if needed  9 tablet  0  . traZODone (DESYREL) 50 MG tablet TAKE 0.5-1 TABLETS (25-50 MG TOTAL) BY MOUTH AT BEDTIME AS NEEDED FOR SLEEP.  30 tablet  5  . zolpidem (AMBIEN) 10 MG tablet TAKE 1 TABLET BY MOUTH AT BEDTIME  30 tablet  5  . Gabapentin Enacarbil (HORIZANT) 600 MG TB24 Take 600 mg by mouth at bedtime. (take at 5pm every day)  30 tablet  1   No facility-administered medications prior to visit.    PE: Blood pressure 127/74, pulse 60, temperature 98.1 F (36.7 C), temperature source Oral, resp. rate 16, height 5\' 11"  (1.803 m), weight 201 lb (91.173 kg), SpO2 98.00%. Gen: Alert, well  appearing.  Patient is oriented to person, place, time, and situation. AFFECT: pleasant, lucid thought and speech. No further exam today.  IMPRESSION AND PLAN:  Bipolar II disorder Tolerating lamictal 150mg  qd but not feeling any improvement in psychiatric state yet. Plan is to continue the 150mg  qd dosing 1 more week and then increase to 200mg  once daily at that time. The plan had been to add fluoxetine to this med once we felt like some mood stabilization has occurred--we're obviously not at that point yet. He is happy that he has at least tolerated the lamictal w/out side effect and he is optimistic about his chances of improvement on this med in the near future. Reviewed side effect profile of lamictal with pt again--particulary what to do if he develops a  rash.  Insomnia Will increase trazodone to 100mg  qhs and decrease ambien to 1/2 of a 10mg  tab qhs. Side effects of trazodone discussed.   An After Visit Summary was printed and given to the patient.  FOLLOW UP: 6 wks

## 2012-10-17 ENCOUNTER — Telehealth: Payer: Self-pay | Admitting: Family Medicine

## 2012-10-17 MED ORDER — FLUOXETINE HCL 20 MG PO TABS: ORAL_TABLET | ORAL | Status: DC

## 2012-10-17 NOTE — Telephone Encounter (Signed)
Dr. Ledon Snare (pt's psychologist) called to update me: pt doing well on lamictal 200mg  w/out side effects and they want to start prozac. Plan is to start at 20mg  and titrate to 40mg  qd. Dr. Ledon Snare will see him weekly and update me as needed.

## 2012-11-08 ENCOUNTER — Encounter: Payer: Self-pay | Admitting: Family Medicine

## 2012-11-08 ENCOUNTER — Ambulatory Visit (INDEPENDENT_AMBULATORY_CARE_PROVIDER_SITE_OTHER): Payer: BC Managed Care – PPO | Admitting: Family Medicine

## 2012-11-08 VITALS — BP 117/74 | HR 63 | Temp 98.8°F | Resp 16 | Ht 71.0 in | Wt 203.0 lb

## 2012-11-08 DIAGNOSIS — Z79899 Other long term (current) drug therapy: Secondary | ICD-10-CM

## 2012-11-08 DIAGNOSIS — G4700 Insomnia, unspecified: Secondary | ICD-10-CM

## 2012-11-08 DIAGNOSIS — G4733 Obstructive sleep apnea (adult) (pediatric): Secondary | ICD-10-CM

## 2012-11-08 DIAGNOSIS — F3189 Other bipolar disorder: Secondary | ICD-10-CM

## 2012-11-08 DIAGNOSIS — F3181 Bipolar II disorder: Secondary | ICD-10-CM

## 2012-11-08 MED ORDER — LAMOTRIGINE 100 MG PO TABS: ORAL_TABLET | ORAL | Status: DC

## 2012-11-08 NOTE — Assessment & Plan Note (Addendum)
No signif change in mood.   He is currently in depressed phase. Discussed need to give lamictal and fluoxetine more time.   Will also push dose of lamictal to 100 mg qAM and 200 mg qhs.  Off label dosing discussed and patient is fine with this.   He is fully aware of possible side effects.  Check BMET today. He'll continue to see his psychologist q 2wks.  Of note, he did say he recalls tolerating depakote fine in the past, so if we need to add this to his lamictal int the future he says he would be open to this.

## 2012-11-08 NOTE — Progress Notes (Signed)
OFFICE NOTE  11/08/2012  CC:  Chief Complaint  Patient presents with  . Follow-up     HPI: Patient is a 41 y.o. Caucasian male who is here for 6 wk f/u bipolar d/o and anxiety. Seeing Dr. Ledon Snare (psychologist) q2 wks. Still feels no change in mood since getting on lamictal, but also still feels no adverse effects from the med.   No rash.  Depressed mood, negative self talk, poor energy level are consistent complaints.  Denies SI or HI. Anxiety also still significantly affecting him. Sleep impairment continues: currently on 100 mg traz and 5mg  ambien. Tolerating prozac ---now at the 20mg  bid dosing--has been back on this for approx 3 wks.  Of note, pt reports that a recent sleep study via Dr. Vassie Loll showed mild/mod OSA and he has seen an orthodontist about getting an oral appliance made for him since he does not tolerate CPAP.  This process looks like it may take 6-8 wks but he's hopeful it will be tolerable and help.  Pertinent PMH:  Past Medical History  Diagnosis Date  . Bipolar II disorder   . Insomnia   . Chronic low back pain     with chronic radiculopathy pain  . OSA (obstructive sleep apnea) 07/2008    Hx of intolerance to CPAP sedondary to anxiety/max intolerance  . Hay fever   . Chronic headaches   . Chronic sinusitis   . GERD (gastroesophageal reflux disease)     with schatski's ring  . Anxiety    Past surgical, social, and family history reviewed and no changes noted since last office visit.  MEDS:  Outpatient Prescriptions Prior to Visit  Medication Sig Dispense Refill  . aluminum chloride (DRYSOL) 20 % external solution Apply topically at bedtime.  60 mL  3  . aspirin-acetaminophen-caffeine (EXCEDRIN MIGRAINE) 250-250-65 MG per tablet Take 1 tablet by mouth every 6 (six) hours as needed.      . Azelastine-Fluticasone (DYMISTA) 137-50 MCG/ACT SUSP Place 2 sprays into the nose 2 (two) times daily.  23 g  6  . cetirizine (ZYRTEC) 10 MG tablet Take 10 mg by mouth  daily.      Marland Kitchen FLUoxetine (PROZAC) 20 MG tablet 2 tabs po qd  60 tablet  3  . fluticasone (FLONASE) 50 MCG/ACT nasal spray       . Naproxen Sodium (ALEVE) 220 MG CAPS Take 1 capsule by mouth as needed.      . rizatriptan (MAXALT) 10 MG tablet Take 1 tablet (10 mg total) by mouth as needed for migraine. May repeat in 2 hours if needed  9 tablet  0  . traZODone (DESYREL) 50 MG tablet TAKE 1-2 TABLETS BY MOUTH AT BEDTIME AS NEEDED FOR SLEEP.  60 tablet  5  . zolpidem (AMBIEN) 10 MG tablet TAKE 1/2-1 TABLET BY MOUTH AT BEDTIME  30 tablet  5  . lamoTRIgine (LAMICTAL) 200 MG tablet Take 1 tablet (200 mg total) by mouth daily.  30 tablet  1  . Gabapentin Enacarbil (HORIZANT) 600 MG TB24 Take 600 mg by mouth at bedtime. (take at 5pm every day)  30 tablet  1   No facility-administered medications prior to visit.    PE: Blood pressure 117/74, pulse 63, temperature 98.8 F (37.1 C), temperature source Temporal, resp. rate 16, height 5\' 11"  (1.803 m), weight 203 lb (92.08 kg), SpO2 97.00%. Gen: Alert, well appearing.  Patient is oriented to person, place, time, and situation. ENT: Eyes: no injection, icteris, swelling, or exudate.  EOMI, PERRLA. Nose: no drainage or turbinate edema/swelling.  No injection or focal lesion.  Mouth: lips without lesion/swelling.  Oral mucosa pink and moist.  Dentition intact and without obvious caries or gingival swelling.  Oropharynx without erythema, exudate, or swelling.  Neck - No masses or thyromegaly or limitation in range of motion CV: RRR, no m/r/g.   LUNGS: CTA bilat, nonlabored resps, good aeration in all lung fields. EXT: no clubbing, cyanosis, or edema.   LAB: none today  IMPRESSION AND PLAN:  Bipolar II disorder No signif change in mood.   He is currently in depressed phase. Discussed need to give lamictal and fluoxetine more time.   Will also push dose of lamictal to 100 mg qAM and 200 mg qhs.  Off label dosing discussed and patient is fine with this.    He is fully aware of possible side effects.  Check BMET today. He'll continue to see his psychologist q 2wks.  Of note, he did say he recalls tolerating depakote fine in the past, so if we need to add this to his lamictal int the future he says he would be open to this.  Insomnia He'll continue to tinker with the trazodone and ambien dosing a bit.  OSA (obstructive sleep apnea) Getting oral appliance through his orthodontist---hopefully within the next 2 months.   An After Visit Summary was printed and given to the patient.  FOLLOW UP:  2 mo

## 2012-11-08 NOTE — Assessment & Plan Note (Signed)
Getting oral appliance through his orthodontist---hopefully within the next 2 months.

## 2012-11-08 NOTE — Assessment & Plan Note (Signed)
He'll continue to tinker with the trazodone and ambien dosing a bit.

## 2012-11-09 LAB — BASIC METABOLIC PANEL
BUN: 19 mg/dL (ref 6–23)
Glucose, Bld: 85 mg/dL (ref 70–99)
Potassium: 4.9 mEq/L (ref 3.5–5.3)

## 2012-12-20 ENCOUNTER — Other Ambulatory Visit: Payer: Self-pay | Admitting: Family Medicine

## 2012-12-20 ENCOUNTER — Telehealth: Payer: Self-pay | Admitting: Family Medicine

## 2012-12-20 MED ORDER — FLUOXETINE HCL 20 MG PO TABS: ORAL_TABLET | ORAL | Status: DC

## 2012-12-20 MED ORDER — LAMOTRIGINE 100 MG PO TABS: ORAL_TABLET | ORAL | Status: DC

## 2012-12-20 NOTE — Telephone Encounter (Signed)
Dr. Ledon Snare, pt's psychologist, called with an update: says pt has progressed very nicely on lamictal and prozac and therapy--"he is at a level of 7/10 regarding how well he's doing"! Pt had tremulousness on 300 mg qd dose of lamictal so he backed down to 200mg  qd dose and has no side effects.  He currently has no side effects from fluoxetine 40mg  daily. Dr. Ledon Snare asked if I would be ok with increasing pt's fluoxetine to 60 mg qd and I said this would be fine: he'll to 50mg  qd x 7d and then increase to 60mg  qd. New rx sent to pharmacy today.

## 2013-01-08 ENCOUNTER — Encounter: Payer: Self-pay | Admitting: Family Medicine

## 2013-01-08 ENCOUNTER — Ambulatory Visit (INDEPENDENT_AMBULATORY_CARE_PROVIDER_SITE_OTHER): Payer: BC Managed Care – PPO | Admitting: Family Medicine

## 2013-01-08 VITALS — BP 126/75 | HR 74 | Temp 97.8°F | Ht 71.0 in | Wt 209.5 lb

## 2013-01-08 DIAGNOSIS — F3189 Other bipolar disorder: Secondary | ICD-10-CM

## 2013-01-08 DIAGNOSIS — F3181 Bipolar II disorder: Secondary | ICD-10-CM

## 2013-01-08 DIAGNOSIS — Z23 Encounter for immunization: Secondary | ICD-10-CM

## 2013-01-08 DIAGNOSIS — G4700 Insomnia, unspecified: Secondary | ICD-10-CM

## 2013-01-08 MED ORDER — TETANUS-DIPHTH-ACELL PERTUSSIS 5-2.5-18.5 LF-MCG/0.5 IM SUSP: 0.5000 mL | Freq: Once | INTRAMUSCULAR | Status: DC

## 2013-01-08 NOTE — Progress Notes (Signed)
OFFICE NOTE  01/08/2013  CC:  Chief Complaint  Patient presents with  . Follow-up     HPI: Patient is a 42 y.o. Caucasian male who is here for 2 mo f/u mood disorder (most likely bipolar II) and insomnia. Recently, after discussion with pt's psychologist, we pushed his fluoxetine dose to 60mg  qd.  He has tolerated lamictal 200 mg qd but a push to 300 mg qd was intolerable (tremulous) for him so he went back to 200mg  qd.  Thea Silversmith 1-2 tabs and sometimes 1/2 ambien to sleep.  He rates his overall mood and sleep as "6/10".  Pertinent PMH:  Past Medical History  Diagnosis Date  . Bipolar II disorder   . Insomnia   . Chronic low back pain     with chronic radiculopathy pain  . OSA (obstructive sleep apnea) 07/2008    Hx of intolerance to CPAP sedondary to anxiety/max intolerance  . Hay fever   . Chronic headaches   . Chronic sinusitis   . GERD (gastroesophageal reflux disease)     with schatski's ring  . Anxiety    Past surgical, social, and family history reviewed and no changes noted since last office visit.  MEDS: Not taking Horizant as listed below Outpatient Prescriptions Prior to Visit  Medication Sig Dispense Refill  . aluminum chloride (DRYSOL) 20 % external solution Apply topically at bedtime.  60 mL  3  . aspirin-acetaminophen-caffeine (EXCEDRIN MIGRAINE) 250-250-65 MG per tablet Take 1 tablet by mouth every 6 (six) hours as needed.      . Azelastine-Fluticasone (DYMISTA) 137-50 MCG/ACT SUSP Place 2 sprays into the nose 2 (two) times daily.  23 g  6  . cetirizine (ZYRTEC) 10 MG tablet Take 10 mg by mouth daily.      Marland Kitchen FLUoxetine (PROZAC) 20 MG tablet 3 tabs po qd  90 tablet  3  . fluticasone (FLONASE) 50 MCG/ACT nasal spray       . lamoTRIgine (LAMICTAL) 100 MG tablet 1 tab po bid  60 tablet  3  . Naproxen Sodium (ALEVE) 220 MG CAPS Take 1 capsule by mouth as needed.      . rizatriptan (MAXALT) 10 MG tablet Take 1 tablet (10 mg total) by mouth as needed for migraine.  May repeat in 2 hours if needed  9 tablet  0  . traZODone (DESYREL) 50 MG tablet TAKE 1-2 TABLETS BY MOUTH AT BEDTIME AS NEEDED FOR SLEEP.  60 tablet  5  . zolpidem (AMBIEN) 10 MG tablet TAKE 1/2-1 TABLET BY MOUTH AT BEDTIME  30 tablet  5  . Gabapentin Enacarbil (HORIZANT) 600 MG TB24 Take 600 mg by mouth at bedtime. (take at 5pm every day)  30 tablet  1   No facility-administered medications prior to visit.    PE: Blood pressure 126/75, pulse 74, temperature 97.8 F (36.6 C), temperature source Oral, height 5\' 11"  (1.803 m), weight 209 lb 8 oz (95.029 kg), SpO2 97.00%. Wt Readings from Last 2 Encounters:  01/08/13 209 lb 8 oz (95.029 kg)  11/08/12 203 lb (92.08 kg)    Gen: alert, oriented x 4, affect pleasant.  Lucid thinking and conversation noted. HEENT: PERRLA, EOMI.   Neck: no LAD, mass, or thyromegaly. CV: RRR, no m/r/g LUNGS: CTA bilat, nonlabored. NEURO: no tremor or tics noted on observation.  Coordination intact. CN 2-12 grossly intact bilaterally, strength 5/5 in all extremeties.  No ataxia.   IMPRESSION AND PLAN:  1) Bipolar II, improving pretty nicely on current  med regimen. Continue meds, continue close f/u with psychologist for ongoing therapy. His insomnia is pretty well controlled.  No changes at this time.  Tdap booster given today. He is already UTd on flu vaccine this season.  FOLLOW UP: 4 mo

## 2013-01-08 NOTE — Addendum Note (Signed)
Addended by: Marlene Lard on: 01/08/2013 08:36 AM   Modules accepted: Orders

## 2013-05-09 ENCOUNTER — Other Ambulatory Visit: Payer: Self-pay | Admitting: Family Medicine

## 2013-05-09 ENCOUNTER — Telehealth: Payer: Self-pay | Admitting: Family Medicine

## 2013-05-09 MED ORDER — TRAZODONE HCL 50 MG PO TABS: ORAL_TABLET | ORAL | Status: DC

## 2013-05-09 MED ORDER — LAMOTRIGINE 100 MG PO TABS: ORAL_TABLET | ORAL | Status: DC

## 2013-05-09 MED ORDER — FLUOXETINE HCL 20 MG PO TABS: ORAL_TABLET | ORAL | Status: DC

## 2013-05-09 MED ORDER — ZOLPIDEM TARTRATE 10 MG PO TABS: ORAL_TABLET | ORAL | Status: DC

## 2013-05-09 NOTE — Telephone Encounter (Signed)
I sent in trazadone, prozac, and lamictal.  Last seen 01/08/13. Last rx was 09/27/12 x 5 refills.  Please advise if we can send in Rx for ambien for patient.

## 2013-05-09 NOTE — Telephone Encounter (Signed)
Ambien rx printed and can be faxed to his pharmacy.-thx

## 2013-05-09 NOTE — Telephone Encounter (Signed)
Patient has an appt tomorrow however the weather may not cooperate. Patient is almost out of meds. Can an Rx be sent in today?

## 2013-05-10 ENCOUNTER — Ambulatory Visit: Payer: BC Managed Care – PPO | Admitting: Family Medicine

## 2013-05-11 ENCOUNTER — Encounter: Payer: Self-pay | Admitting: Family Medicine

## 2013-05-11 ENCOUNTER — Ambulatory Visit (INDEPENDENT_AMBULATORY_CARE_PROVIDER_SITE_OTHER): Payer: BC Managed Care – PPO | Admitting: Family Medicine

## 2013-05-11 VITALS — BP 113/82 | HR 69 | Temp 98.0°F | Resp 18 | Ht 71.0 in | Wt 211.0 lb

## 2013-05-11 DIAGNOSIS — F411 Generalized anxiety disorder: Secondary | ICD-10-CM | POA: Insufficient documentation

## 2013-05-11 DIAGNOSIS — F3189 Other bipolar disorder: Secondary | ICD-10-CM

## 2013-05-11 DIAGNOSIS — F3181 Bipolar II disorder: Secondary | ICD-10-CM

## 2013-05-11 DIAGNOSIS — G4700 Insomnia, unspecified: Secondary | ICD-10-CM

## 2013-05-11 DIAGNOSIS — T887XXA Unspecified adverse effect of drug or medicament, initial encounter: Secondary | ICD-10-CM

## 2013-05-11 NOTE — Progress Notes (Signed)
Pre visit review using our clinic review tool, if applicable. No additional management support is needed unless otherwise documented below in the visit note. 

## 2013-05-11 NOTE — Progress Notes (Signed)
OFFICE NOTE  05/11/2013  CC: f/u mood d/o, insomnia, anxiety   HPI: Patient is a 43 y.o. Caucasian male who is here for 4 mo f/u bipolar II, insomnia, and anxiety. Describes being on a bit of a downturn regarding mood lately.  He had decreased his lamictal dosing to 100mg  qd due to tremulous UE's and he immediately felt a depressed mood.  He then increased his dose back to 200mg  even though his tremulous hands had improved some on the 100mg  dosing. He notes the tremors getting a bit worse again but his depression began to lift coincidental with increase in lamictal back to 200 mg qd.   Describes some urinary frequency and poor stream ever since getting on high dose prozac.  This has been stable over time.   Sleep is decent/stable taking 1/2 of 10mg  zolpidem and 2 trazodone qhs.  He still sees Dr. Ledon Snare, his psychologist, every other week and still finds this very helpful.  ROS: tiny little bumps noted about a month ago on medial forearms, medial thighs.  No hives, no ulcers, no pustules or vesicles.  No oral mucosal lesions.  Pertinent PMH:  Past medical, surgical, social, and family history reviewed and no changes are noted since last office visit.  MEDS:  Outpatient Prescriptions Prior to Visit  Medication Sig Dispense Refill  . aluminum chloride (DRYSOL) 20 % external solution Apply topically at bedtime.  60 mL  3  . aspirin-acetaminophen-caffeine (EXCEDRIN MIGRAINE) 250-250-65 MG per tablet Take 1 tablet by mouth every 6 (six) hours as needed.      . Azelastine-Fluticasone (DYMISTA) 137-50 MCG/ACT SUSP Place 2 sprays into the nose 2 (two) times daily.  23 g  6  . cetirizine (ZYRTEC) 10 MG tablet Take 10 mg by mouth daily.      Marland Kitchen FLUoxetine (PROZAC) 20 MG tablet 3 tabs po qd  90 tablet  3  . lamoTRIgine (LAMICTAL) 100 MG tablet 1 tab po bid  60 tablet  3  . Naproxen Sodium (ALEVE) 220 MG CAPS Take 1 capsule by mouth as needed.      . rizatriptan (MAXALT) 10 MG tablet Take 1 tablet  (10 mg total) by mouth as needed for migraine. May repeat in 2 hours if needed  9 tablet  0  . traZODone (DESYREL) 50 MG tablet TAKE 1-2 TABLETS BY MOUTH AT BEDTIME AS NEEDED FOR SLEEP.  60 tablet  3  . zolpidem (AMBIEN) 10 MG tablet TAKE 1/2-1 TABLET BY MOUTH AT BEDTIME  30 tablet  5  . fluticasone (FLONASE) 50 MCG/ACT nasal spray        Facility-Administered Medications Prior to Visit  Medication Dose Route Frequency Provider Last Rate Last Dose  . TDaP (BOOSTRIX) injection 0.5 mL  0.5 mL Intramuscular Once Jeoffrey Massed, MD        PE: Blood pressure 113/82, pulse 69, temperature 98 F (36.7 C), temperature source Temporal, resp. rate 18, height 5\' 11"  (1.803 m), weight 211 lb (95.709 kg), SpO2 98.00%. Wt Readings from Last 2 Encounters:  05/11/13 211 lb (95.709 kg)  01/08/13 209 lb 8 oz (95.029 kg)   Gen: alert, oriented x 4, affect pleasant.  Lucid thinking and conversation noted. HEENT: PERRLA, EOMI.  Lips normal.  Tongue and mouth/throat w/out lesion. Neck: no LAD, mass, or thyromegaly. CV: RRR, no m/r/g LUNGS: CTA bilat, nonlabored. NEURO: mild tremulousness bilat with FNF.  Minimal tremor with holding hands outstretched in front of him.   No tics noted on  observation.  Coordination intact. DTRs 3+ patella bilat, 2+ achilles bilat, 1 beat clonus bilat.  2+ UE DTRs bilat. CN 2-12 grossly intact bilaterally, strength 5/5 in all extremeties.  No ataxia. SKIN: some scattered pinkish color to base of hair follicles on both medial thigh regions. No vesicles, no pustules, no urticarial lesions.   IMPRESSION AND PLAN:  1) Bipolar II disorder: slight depression lately but overall stable and no need for med adjustment. Continue 200 mg qd lamictal.  Recent skin changes not worrisome but pt to keep monitoring.  2) Anxiety: stable.  3) Insomnia: stable.  Continue current meds.  FOLLOW UP:  4 mo

## 2013-09-06 ENCOUNTER — Encounter: Payer: Self-pay | Admitting: Family Medicine

## 2013-09-06 ENCOUNTER — Ambulatory Visit (INDEPENDENT_AMBULATORY_CARE_PROVIDER_SITE_OTHER): Payer: BC Managed Care – PPO | Admitting: Family Medicine

## 2013-09-06 VITALS — BP 107/72 | HR 69 | Temp 98.3°F | Resp 18 | Ht 71.0 in | Wt 216.0 lb

## 2013-09-06 DIAGNOSIS — F411 Generalized anxiety disorder: Secondary | ICD-10-CM

## 2013-09-06 DIAGNOSIS — F3189 Other bipolar disorder: Secondary | ICD-10-CM

## 2013-09-06 DIAGNOSIS — J301 Allergic rhinitis due to pollen: Secondary | ICD-10-CM

## 2013-09-06 DIAGNOSIS — R21 Rash and other nonspecific skin eruption: Secondary | ICD-10-CM | POA: Insufficient documentation

## 2013-09-06 DIAGNOSIS — F3181 Bipolar II disorder: Secondary | ICD-10-CM

## 2013-09-06 DIAGNOSIS — J309 Allergic rhinitis, unspecified: Secondary | ICD-10-CM | POA: Insufficient documentation

## 2013-09-06 DIAGNOSIS — G4700 Insomnia, unspecified: Secondary | ICD-10-CM

## 2013-09-06 MED ORDER — FLUOXETINE HCL 20 MG PO TABS: ORAL_TABLET | ORAL | Status: DC

## 2013-09-06 MED ORDER — AZELASTINE-FLUTICASONE 137-50 MCG/ACT NA SUSP: 2.0000 | Freq: Two times a day (BID) | NASAL | Status: DC

## 2013-09-06 MED ORDER — TRAZODONE HCL 50 MG PO TABS: ORAL_TABLET | ORAL | Status: DC

## 2013-09-06 NOTE — Progress Notes (Signed)
OFFICE NOTE  09/06/2013  CC:  Chief Complaint  Patient presents with  . Follow-up    4 months   HPI: Patient is a 43 y.o. Caucasian male who is here for f/u mood d/o, anxiety, insomnia. Doing pretty well. Sleep is okay still, not great. Still with occ rash consisting of little bumps on arms and legs that come and go.  No vesicles or pustules or hives. It itches some but otherwise he doesn't feel any systemic sx's.  Usually worse in heat. Hand tremors still at minimal level. Still seeing Dr. Nolen MuMcKinney every 2 wks. Allergies just a touch worse lately: he feels like the zyrtec (and dymista) are not as effective lately. Needs RFs of prozac, trazodone.  Pertinent PMH:  Past medical, surgical, social, and family history reviewed and no changes are noted since last office visit.  MEDS:  Outpatient Prescriptions Prior to Visit  Medication Sig Dispense Refill  . aluminum chloride (DRYSOL) 20 % external solution Apply topically at bedtime.  60 mL  3  . aspirin-acetaminophen-caffeine (EXCEDRIN MIGRAINE) 250-250-65 MG per tablet Take 1 tablet by mouth every 6 (six) hours as needed.      . Azelastine-Fluticasone (DYMISTA) 137-50 MCG/ACT SUSP Place 2 sprays into the nose 2 (two) times daily.  23 g  6  . cetirizine (ZYRTEC) 10 MG tablet Take 10 mg by mouth daily.      Marland Kitchen. FLUoxetine (PROZAC) 20 MG tablet 3 tabs po qd  90 tablet  3  . lamoTRIgine (LAMICTAL) 100 MG tablet 1 tab po bid  60 tablet  3  . Naproxen Sodium (ALEVE) 220 MG CAPS Take 1 capsule by mouth as needed.      . rizatriptan (MAXALT) 10 MG tablet Take 1 tablet (10 mg total) by mouth as needed for migraine. May repeat in 2 hours if needed  9 tablet  0  . traZODone (DESYREL) 50 MG tablet TAKE 1-2 TABLETS BY MOUTH AT BEDTIME AS NEEDED FOR SLEEP.  60 tablet  3  . zolpidem (AMBIEN) 10 MG tablet TAKE 1/2-1 TABLET BY MOUTH AT BEDTIME  30 tablet  5   Facility-Administered Medications Prior to Visit  Medication Dose Route Frequency Provider  Last Rate Last Dose  . TDaP (BOOSTRIX) injection 0.5 mL  0.5 mL Intramuscular Once Jeoffrey MassedPhilip H McGowen, MD        PE: Blood pressure 107/72, pulse 69, temperature 98.3 F (36.8 C), temperature source Temporal, resp. rate 18, height 5\' 11"  (1.803 m), weight 216 lb (97.977 kg), SpO2 97.00%. Gen: Alert, well appearing.  Patient is oriented to person, place, time, and situation. AFFECT: pleasant, lucid thought and speech. SKIN: no rash currently. No further exam today.  IMPRESSION AND PLAN:  1) bipolar d/o: stable on lamictal and prozac.  Continue psychologist f/u--this has been very helpful for pt.  2) Anxiety d/o: stable on prozac.  3) Rash: benign, possibly related to his lamictal. Signs/symptoms to call or return for were reviewed and pt expressed understanding.  4) Insomnia.  The current medical regimen is effective;  continue present plan and medications.  5) Allergic rhinitis: stay on dymista and ROTATE nonsedating antihistamines [id number] since his zyrtec seems to not be helping much lately.  An After Visit Summary was printed and given to the patient.  FOLLOW UP: 62mo for CPE w/fasting labs

## 2013-09-06 NOTE — Progress Notes (Signed)
Pre visit review using our clinic review tool, if applicable. No additional management support is needed unless otherwise documented below in the visit note. 

## 2013-10-31 ENCOUNTER — Other Ambulatory Visit: Payer: Self-pay | Admitting: Family Medicine

## 2013-12-10 ENCOUNTER — Telehealth: Payer: Self-pay

## 2013-12-10 MED ORDER — ZOLPIDEM TARTRATE 10 MG PO TABS: ORAL_TABLET | ORAL | Status: DC

## 2013-12-10 NOTE — Telephone Encounter (Signed)
rx request for zolpidem tartrate 10 mg tabs qty 30 w/ 5 rfs.  Rx done and faxed into pharmacy

## 2013-12-11 ENCOUNTER — Other Ambulatory Visit: Payer: Self-pay | Admitting: Family Medicine

## 2013-12-11 NOTE — Telephone Encounter (Signed)
Duplicate request. Please disregard.

## 2013-12-20 ENCOUNTER — Other Ambulatory Visit: Payer: Self-pay | Admitting: Family Medicine

## 2013-12-20 NOTE — Telephone Encounter (Signed)
Received RF request for trazadone through [facility] but [facility] states pt just picked up 90 day supply 12/17/13.  Faxed RX request back to [facility] for them to get RX transferred.

## 2014-02-19 ENCOUNTER — Encounter: Payer: Self-pay | Admitting: Family Medicine

## 2014-02-19 ENCOUNTER — Ambulatory Visit (INDEPENDENT_AMBULATORY_CARE_PROVIDER_SITE_OTHER): Payer: BC Managed Care – PPO | Admitting: Family Medicine

## 2014-02-19 VITALS — BP 125/78 | HR 75 | Temp 98.4°F | Resp 18 | Ht 71.0 in | Wt 226.0 lb

## 2014-02-19 DIAGNOSIS — Z0000 Encounter for general adult medical examination without abnormal findings: Secondary | ICD-10-CM

## 2014-02-19 LAB — CBC WITH DIFFERENTIAL/PLATELET
BASOS PCT: 0.4 % (ref 0.0–3.0)
Basophils Absolute: 0 10*3/uL (ref 0.0–0.1)
EOS ABS: 0.1 10*3/uL (ref 0.0–0.7)
EOS PCT: 2.5 % (ref 0.0–5.0)
HEMATOCRIT: 46.6 % (ref 39.0–52.0)
Hemoglobin: 15.3 g/dL (ref 13.0–17.0)
LYMPHS ABS: 1.6 10*3/uL (ref 0.7–4.0)
Lymphocytes Relative: 27.2 % (ref 12.0–46.0)
MCHC: 32.9 g/dL (ref 30.0–36.0)
MCV: 85.5 fl (ref 78.0–100.0)
MONO ABS: 0.3 10*3/uL (ref 0.1–1.0)
Monocytes Relative: 4.9 % (ref 3.0–12.0)
NEUTROS PCT: 65 % (ref 43.0–77.0)
Neutro Abs: 3.8 10*3/uL (ref 1.4–7.7)
PLATELETS: 234 10*3/uL (ref 150.0–400.0)
RBC: 5.45 Mil/uL (ref 4.22–5.81)
RDW: 14.6 % (ref 11.5–15.5)
WBC: 5.8 10*3/uL (ref 4.0–10.5)

## 2014-02-19 LAB — COMPREHENSIVE METABOLIC PANEL
ALK PHOS: 55 U/L (ref 39–117)
ALT: 26 U/L (ref 0–53)
AST: 26 U/L (ref 0–37)
Albumin: 4.6 g/dL (ref 3.5–5.2)
BUN: 21 mg/dL (ref 6–23)
CO2: 29 mEq/L (ref 19–32)
Calcium: 9.8 mg/dL (ref 8.4–10.5)
Chloride: 106 mEq/L (ref 96–112)
Creatinine, Ser: 1.3 mg/dL (ref 0.4–1.5)
GFR: 67.12 mL/min (ref >60.00)
Glucose, Bld: 88 mg/dL (ref 70–99)
POTASSIUM: 4.7 meq/L (ref 3.5–5.1)
Sodium: 140 mEq/L (ref 135–145)
Total Bilirubin: 0.4 mg/dL (ref 0.2–1.2)
Total Protein: 7.5 g/dL (ref 6.0–8.3)

## 2014-02-19 LAB — LDL CHOLESTEROL, DIRECT: Direct LDL: 111.6 mg/dL

## 2014-02-19 LAB — LIPID PANEL
CHOL/HDL RATIO: 5
CHOLESTEROL: 198 mg/dL (ref 0–200)
HDL: 38.9 mg/dL — AB (ref >39.00)
NONHDL: 159.1
Triglycerides: 225 mg/dL — ABNORMAL HIGH (ref 0.0–149.0)
VLDL: 45 mg/dL — ABNORMAL HIGH (ref 0.0–40.0)

## 2014-02-19 LAB — TSH: TSH: 1.6 u[IU]/mL (ref 0.35–4.50)

## 2014-02-19 MED ORDER — ZOLPIDEM TARTRATE 10 MG PO TABS: ORAL_TABLET | ORAL | Status: DC

## 2014-02-19 MED ORDER — FLUOXETINE HCL 20 MG PO TABS: ORAL_TABLET | ORAL | Status: DC

## 2014-02-19 MED ORDER — LAMOTRIGINE 100 MG PO TABS: 100.0000 mg | ORAL_TABLET | Freq: Two times a day (BID) | ORAL | Status: DC

## 2014-02-19 MED ORDER — AZELASTINE-FLUTICASONE 137-50 MCG/ACT NA SUSP: 2.0000 | Freq: Two times a day (BID) | NASAL | Status: DC

## 2014-02-19 MED ORDER — ALBUTEROL SULFATE HFA 108 (90 BASE) MCG/ACT IN AERS: 2.0000 | INHALATION_SPRAY | Freq: Four times a day (QID) | RESPIRATORY_TRACT | Status: DC | PRN

## 2014-02-19 MED ORDER — TRAZODONE HCL 50 MG PO TABS: ORAL_TABLET | ORAL | Status: DC

## 2014-02-19 NOTE — Progress Notes (Signed)
Office Note 02/19/2014  CC:  Chief Complaint  Patient presents with  . Annual Exam    fasting   HPI:  Alex Hall is a 43 y.o. White male who is here for CPE, fasting.  Notes some wheezing/SOB lately, sometimes spontaneously when lying down (not SOB just an odd pop sound when taking deep breaths) or other times with exercise (usually after exercising a while, chest tight/wheeze, goes away with 5 min of rest).   Mentally, doing "fair".  Getting counseling still.  Sounds like he is having some maritial issues of late, sounds like he doesn't really want to expand on this today.   Past Medical History  Diagnosis Date  . Bipolar II disorder   . Insomnia   . Chronic low back pain     with chronic radiculopathy pain  . OSA (obstructive sleep apnea) 07/2008    Hx of intolerance to CPAP sedondary to anxiety/max intolerance  . Hay fever   . Chronic headaches   . Chronic sinusitis   . GERD (gastroesophageal reflux disease)     with schatski's ring  . Anxiety     Past Surgical History  Procedure Laterality Date  . Lumbar back surg  2010    Family History  Problem Relation Age of Onset  . CVA Father   . Arthritis-Osteo Mother     History   Social History  . Marital Status: Married    Spouse Name: N/A    Number of Children: N/A  . Years of Education: N/A   Occupational History  . Acupuncturistelectrical engineer    Social History Main Topics  . Smoking status: Never Smoker   . Smokeless tobacco: Never Used  . Alcohol Use: No  . Drug Use: No  . Sexual Activity: Not on file   Other Topics Concern  . Not on file   Social History Narrative   Divorced, remarried.   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   College educated.   Normal american diet.  No exercise.    Outpatient Prescriptions Prior to Visit  Medication Sig Dispense Refill  . aluminum chloride (DRYSOL) 20 % external solution Apply topically at bedtime. 60 mL 3  .  aspirin-acetaminophen-caffeine (EXCEDRIN MIGRAINE) 250-250-65 MG per tablet Take 1 tablet by mouth every 6 (six) hours as needed.    . Azelastine-Fluticasone (DYMISTA) 137-50 MCG/ACT SUSP Place 2 sprays into the nose 2 (two) times daily. 3 Bottle 3  . cetirizine (ZYRTEC) 10 MG tablet Take 10 mg by mouth daily.    Marland Kitchen. FLUoxetine (PROZAC) 20 MG tablet 3 tabs po qd 270 tablet 3  . lamoTRIgine (LAMICTAL) 100 MG tablet TAKE 1 TABLET BY MOUTH TWICE A DAY 60 tablet 3  . Naproxen Sodium (ALEVE) 220 MG CAPS Take 1 capsule by mouth as needed.    . traZODone (DESYREL) 50 MG tablet TAKE 1-2 TABLETS BY MOUTH AT BEDTIME AS NEEDED FOR SLEEP. 180 tablet 3  . rizatriptan (MAXALT) 10 MG tablet Take 1 tablet (10 mg total) by mouth as needed for migraine. May repeat in 2 hours if needed (Patient not taking: Reported on 02/19/2014) 9 tablet 0  . zolpidem (AMBIEN) 10 MG tablet TAKE 1/2-1 TABLET BY MOUTH AT BEDTIME (Patient not taking: Reported on 02/19/2014) 30 tablet 5   Facility-Administered Medications Prior to Visit  Medication Dose Route Frequency Provider Last Rate Last Dose  . TDaP (BOOSTRIX) injection 0.5 mL  0.5 mL Intramuscular Once Jeoffrey MassedPhilip H Sheddrick Lattanzio, MD  Allergies  Allergen Reactions  . Clonazepam Other (See Comments)    manic  . Effexor [Venlafaxine Hcl] Other (See Comments)    "bad side effects"  . Risperdal [Risperidone] Other (See Comments)    "ineffective + "side effects"  . Stelazine [Trifluoperazine] Other (See Comments)    "terrible, awful side effects"  . Zyprexa [Olanzapine] Other (See Comments)    "terrible, awful side effects"    ROS Review of Systems  Constitutional: Negative for fever, chills, appetite change and fatigue.  HENT: Negative for congestion, dental problem, ear pain and sore throat.   Eyes: Negative for discharge, redness and visual disturbance.  Respiratory: Positive for chest tightness (as per HPI) and wheezing (as per HPI). Negative for cough and shortness of  breath.   Cardiovascular: Negative for chest pain, palpitations and leg swelling.  Gastrointestinal: Negative for nausea, vomiting, abdominal pain, diarrhea and blood in stool.  Genitourinary: Negative for dysuria, urgency, frequency, hematuria, flank pain and difficulty urinating.  Musculoskeletal: Negative for myalgias, back pain, joint swelling, arthralgias and neck stiffness.  Skin: Negative for pallor and rash.  Neurological: Negative for dizziness, speech difficulty, weakness and headaches.  Hematological: Negative for adenopathy. Does not bruise/bleed easily.  Psychiatric/Behavioral: Negative for confusion and sleep disturbance. The patient is not nervous/anxious.     PE; Blood pressure 125/78, pulse 75, temperature 98.4 F (36.9 C), temperature source Temporal, resp. rate 18, height 5\' 11"  (1.803 m), weight 226 lb (102.513 kg), SpO2 94 %. Gen: Alert, well appearing.  Patient is oriented to person, place, time, and situation. AFFECT: pleasant, lucid thought and speech. ENT: Ears: EACs clear, normal epithelium.  TMs with good light reflex and landmarks bilaterally.  Eyes: no injection, icteris, swelling, or exudate.  EOMI, PERRLA. Nose: no drainage or turbinate edema/swelling.  No injection or focal lesion.  Mouth: lips without lesion/swelling.  Oral mucosa pink and moist.  Dentition intact and without obvious caries or gingival swelling.  Oropharynx without erythema, exudate, or swelling.  Neck: supple/nontender.  No LAD, mass, or TM.  Carotid pulses 2+ bilaterally, without bruits. CV: RRR, no m/r/g.   LUNGS: CTA bilat, nonlabored resps, good aeration in all lung fields. ABD: soft, NT, ND, BS normal.  No hepatospenomegaly or mass.  No bruits. EXT: no clubbing, cyanosis, or edema.  Musculoskeletal: no joint swelling, erythema, warmth, or tenderness.  ROM of all joints intact. Skin - no sores or suspicious lesions or rashes or color changes  Pertinent labs:  None today  ASSESSMENT  AND PLAN:   Health maintenance examination Reviewed age and gender appropriate health maintenance issues (prudent diet, regular exercise, health risks of tobacco and excessive alcohol, use of seatbelts, fire alarms in home, use of sunscreen).  Also reviewed age and gender appropriate health screening as well as vaccine recommendations. He is UTD on vaccines. HP labs today. Trial of ProAir HFA for exercise induced bronchospasm sx's he's been describing. Continue all other chronic meds as-is. Continue psych counseling.   An After Visit Summary was printed and given to the patient.  FOLLOW UP:  Return in about 6 months (around 08/21/2014) for routine chronic illness f/u.

## 2014-02-19 NOTE — Progress Notes (Signed)
Pre visit review using our clinic review tool, if applicable. No additional management support is needed unless otherwise documented below in the visit note. 

## 2014-02-19 NOTE — Assessment & Plan Note (Signed)
Reviewed age and gender appropriate health maintenance issues (prudent diet, regular exercise, health risks of tobacco and excessive alcohol, use of seatbelts, fire alarms in home, use of sunscreen).  Also reviewed age and gender appropriate health screening as well as vaccine recommendations. He is UTD on vaccines. HP labs today. Trial of ProAir HFA for exercise induced bronchospasm sx's he's been describing. Continue all other chronic meds as-is. Continue psych counseling.

## 2014-02-21 ENCOUNTER — Encounter: Payer: Self-pay | Admitting: Family Medicine

## 2014-04-29 ENCOUNTER — Ambulatory Visit: Payer: Self-pay | Admitting: Family Medicine

## 2014-05-01 ENCOUNTER — Ambulatory Visit (INDEPENDENT_AMBULATORY_CARE_PROVIDER_SITE_OTHER): Payer: BLUE CROSS/BLUE SHIELD | Admitting: Family Medicine

## 2014-05-01 ENCOUNTER — Encounter: Payer: Self-pay | Admitting: Family Medicine

## 2014-05-01 VITALS — BP 118/75 | HR 73 | Temp 98.3°F | Resp 18 | Ht 71.0 in | Wt 225.0 lb

## 2014-05-01 DIAGNOSIS — N138 Other obstructive and reflux uropathy: Secondary | ICD-10-CM

## 2014-05-01 DIAGNOSIS — R300 Dysuria: Secondary | ICD-10-CM

## 2014-05-01 DIAGNOSIS — N401 Enlarged prostate with lower urinary tract symptoms: Secondary | ICD-10-CM

## 2014-05-01 LAB — POCT URINALYSIS DIPSTICK
BILIRUBIN UA: NEGATIVE
Blood, UA: NEGATIVE
Glucose, UA: NEGATIVE
KETONES UA: NEGATIVE
LEUKOCYTES UA: NEGATIVE
Nitrite, UA: NEGATIVE
PH UA: 6
Protein, UA: NEGATIVE
Spec Grav, UA: >=1.03
Urobilinogen, UA: 0.2

## 2014-05-01 MED ORDER — TAMSULOSIN HCL 0.4 MG PO CAPS: 0.4000 mg | ORAL_CAPSULE | Freq: Every day | ORAL | Status: DC

## 2014-05-01 MED ORDER — SULFAMETHOXAZOLE-TRIMETHOPRIM 800-160 MG PO TABS: 1.0000 | ORAL_TABLET | Freq: Two times a day (BID) | ORAL | Status: DC

## 2014-05-01 NOTE — Progress Notes (Signed)
Pre visit review using our clinic review tool, if applicable. No additional management support is needed unless otherwise documented below in the visit note. 

## 2014-05-01 NOTE — Progress Notes (Signed)
OFFICE NOTE  05/01/2014  CC:  Chief Complaint  Patient presents with  . Urinary Frequency    x 1 year, gotten worse in the past two months  . Dysuria   HPI: Patient is a 44 y.o. Caucasian male who is here for urinary complaints. For over a year now has felt difficulty in initiating urination, has weak stream, daytime frequency>nocturia, some discomfort when urinating and right after.  Urine is light yellow color usually.  No blood in urine.   Caffein intake doesn't seem to affect it.  Has hx of prostatitis a couple of times in the past and recalls sx's being worse and feeling nauseated and "very sick".  "Massive increase" in stress over the last 2 mo.  Pertinent PMH:  Past medical, surgical, social, and family history reviewed and no changes are noted since last office visit.  MEDS:  Outpatient Prescriptions Prior to Visit  Medication Sig Dispense Refill  . albuterol (PROAIR HFA) 108 (90 BASE) MCG/ACT inhaler Inhale 2 puffs into the lungs every 6 (six) hours as needed for wheezing or shortness of breath. 1 Inhaler 1  . aluminum chloride (DRYSOL) 20 % external solution Apply topically at bedtime. 60 mL 3  . aspirin-acetaminophen-caffeine (EXCEDRIN MIGRAINE) 250-250-65 MG per tablet Take 1 tablet by mouth every 6 (six) hours as needed.    . Azelastine-Fluticasone (DYMISTA) 137-50 MCG/ACT SUSP Place 2 sprays into the nose 2 (two) times daily. 3 Bottle 3  . cetirizine (ZYRTEC) 10 MG tablet Take 10 mg by mouth daily.    Marland Kitchen. FLUoxetine (PROZAC) 20 MG tablet 3 tabs po qd 270 tablet 3  . lamoTRIgine (LAMICTAL) 100 MG tablet Take 1 tablet (100 mg total) by mouth 2 (two) times daily. 180 tablet 3  . Naproxen Sodium (ALEVE) 220 MG CAPS Take 1 capsule by mouth as needed.    . traZODone (DESYREL) 50 MG tablet TAKE 1-2 TABLETS BY MOUTH AT BEDTIME AS NEEDED FOR SLEEP. 180 tablet 3  . zolpidem (AMBIEN) 10 MG tablet TAKE 1/2-1 TABLET BY MOUTH AT BEDTIME 30 tablet 5  . rizatriptan (MAXALT) 10 MG tablet  Take 1 tablet (10 mg total) by mouth as needed for migraine. May repeat in 2 hours if needed (Patient not taking: Reported on 05/01/2014) 9 tablet 0   Facility-Administered Medications Prior to Visit  Medication Dose Route Frequency Provider Last Rate Last Dose  . TDaP (BOOSTRIX) injection 0.5 mL  0.5 mL Intramuscular Once Jeoffrey MassedPhilip H Davyn Elsasser, MD        PE: Blood pressure 118/75, pulse 73, temperature 98.3 F (36.8 C), temperature source Temporal, resp. rate 18, height 5\' 11"  (1.803 m), weight 225 lb (102.059 kg), SpO2 97 %. Gen: Alert, well appearing.  Patient is oriented to person, place, time, and situation. Rectal exam: negative without mass, lesions or tenderness, PROSTATE EXAM: smooth and symmetric without nodules or tenderness.  Palpation of prostate makes him feel like he has to urinate.  I can feel bladder fullness upon exam of prostate.   LAB: CC UA today was completely normal except SG >1.030  IMPRESSION AND PLAN:  BPH with lower urinary tract obstruction symptoms. Symptoms building lately, question mild prostatitis contributing.  Will start tamsulosin 0.4mg  qhs.  Therapeutic expectations and side effect profile of medication discussed today.  Patient's questions answered. Also I will give 10 days of bactrim DS, 1 bid.  An After Visit Summary was printed and given to the patient.  FOLLOW UP:  2 weeks

## 2014-05-15 ENCOUNTER — Encounter: Payer: Self-pay | Admitting: Family Medicine

## 2014-05-15 ENCOUNTER — Ambulatory Visit (INDEPENDENT_AMBULATORY_CARE_PROVIDER_SITE_OTHER): Payer: BLUE CROSS/BLUE SHIELD | Admitting: Family Medicine

## 2014-05-15 VITALS — BP 108/70 | HR 76 | Temp 98.3°F | Ht 71.0 in | Wt 221.0 lb

## 2014-05-15 DIAGNOSIS — N401 Enlarged prostate with lower urinary tract symptoms: Secondary | ICD-10-CM

## 2014-05-15 DIAGNOSIS — N138 Other obstructive and reflux uropathy: Secondary | ICD-10-CM

## 2014-05-15 MED ORDER — FINASTERIDE 5 MG PO TABS: 5.0000 mg | ORAL_TABLET | Freq: Every day | ORAL | Status: DC

## 2014-05-15 NOTE — Progress Notes (Signed)
OFFICE NOTE  05/15/2014  CC:  Chief Complaint  Patient presents with  . Follow-up   HPI: Patient is a 44 y.o. Caucasian male who is here for 2 wk f/u BPH and prostatitis sx's. Still with same daytime urinary frequency and incomplete emptying and dribbling, stream.  His pain assoc with these sx's is much better.  +Compliant with bactrim and flomax (0.8 qhs AND recently 0.4 qAM).  No nocturia.   ROS: "feel like sinuses have ruptured and bleeding". Still very stressed: he [place] his wife have separated--[place].   Pertinent PMH:  Past medical, surgical, social, and family history reviewed and no changes are noted since last office visit.  MEDS:  Outpatient Prescriptions Prior to Visit  Medication Sig Dispense Refill  . albuterol (PROAIR HFA) 108 (90 BASE) MCG/ACT inhaler Inhale 2 puffs into the lungs every 6 (six) hours as needed for wheezing or shortness of breath. 1 Inhaler 1  . aluminum chloride (DRYSOL) 20 % external solution Apply topically at bedtime. 60 mL 3  . aspirin-acetaminophen-caffeine (EXCEDRIN MIGRAINE) 250-250-65 MG per tablet Take 1 tablet by mouth every 6 (six) hours as needed.    . Azelastine-Fluticasone (DYMISTA) 137-50 MCG/ACT SUSP Place 2 sprays into the nose 2 (two) times daily. 3 Bottle 3  . cetirizine (ZYRTEC) 10 MG tablet Take 10 mg by mouth daily.    Marland Kitchen. FLUoxetine (PROZAC) 20 MG tablet 3 tabs po qd 270 tablet 3  . lamoTRIgine (LAMICTAL) 100 MG tablet Take 1 tablet (100 mg total) by mouth 2 (two) times daily. 180 tablet 3  . Naproxen Sodium (ALEVE) 220 MG CAPS Take 1 capsule by mouth as needed.    . rizatriptan (MAXALT) 10 MG tablet Take 1 tablet (10 mg total) by mouth as needed for migraine. May repeat in 2 hours if needed 9 tablet 0  . sulfamethoxazole-trimethoprim (BACTRIM DS,SEPTRA DS) 800-160 MG per tablet Take 1 tablet by mouth 2 (two) times daily. 20 tablet 0  . tamsulosin (FLOMAX) 0.4 MG CAPS capsule Take 1 capsule (0.4 mg total) by  mouth daily. 30 capsule 1  . traZODone (DESYREL) 50 MG tablet TAKE 1-2 TABLETS BY MOUTH AT BEDTIME AS NEEDED FOR SLEEP. 180 tablet 3  . zolpidem (AMBIEN) 10 MG tablet TAKE 1/2-1 TABLET BY MOUTH AT BEDTIME 30 tablet 5   Facility-Administered Medications Prior to Visit  Medication Dose Route Frequency Provider Last Rate Last Dose  . TDaP (BOOSTRIX) injection 0.5 mL  0.5 mL Intramuscular Once Jeoffrey MassedPhilip H Faten Frieson, MD        PE: Blood pressure 108/70, pulse 76, temperature 98.3 F (36.8 C), temperature source Temporal, height 5\' 11"  (1.803 m), weight 221 lb (100.245 kg), SpO2 96 %. VS: noted--normal. Gen: alert, NAD, NONTOXIC APPEARING. HEENT: eyes without injection, drainage, or swelling.  Ears: EACs clear, TMs with normal light reflex and landmarks.  Nose: Clear rhinorrhea, with some dried, crusty exudate adherent to mildly injected mucosa.  No purulent d/c.  No paranasal sinus TTP.  No facial swelling.  Throat and mouth without focal lesion.  No pharyngial swelling, erythema, or exudate.   Neck: supple, no LAD.   LUNGS: CTA bilat, nonlabored resps.   CV: RRR, no m/r/g. EXT: no c/c/e SKIN: no rash    IMPRESSION AND PLAN:  BPH with LUTS; not responding to flomax. I think any infection that was contributing is gone s/p bactrim. Will have him cut back to 0.8mg  qhs (cut out morning dose) and will add proscar and  refer to Urology for confirmation of dx since he is not responding at all regarding LUT obstructive sx's.  Nasal irritation/dryness/easy bleeding: reassure him that his nose looks ok, encouraged saline moisturization. I do not think he has a sinus infection.  An After Visit Summary was printed and given to the patient.  FOLLOW UP: prn

## 2014-05-15 NOTE — Progress Notes (Signed)
Pre visit review using our clinic review tool, if applicable. No additional management support is needed unless otherwise documented below in the visit note. 

## 2014-08-21 ENCOUNTER — Ambulatory Visit (INDEPENDENT_AMBULATORY_CARE_PROVIDER_SITE_OTHER): Payer: BLUE CROSS/BLUE SHIELD | Admitting: Family Medicine

## 2014-08-21 ENCOUNTER — Encounter: Payer: Self-pay | Admitting: Family Medicine

## 2014-08-21 VITALS — BP 110/73 | HR 63 | Temp 98.0°F | Resp 16 | Wt 223.0 lb

## 2014-08-21 DIAGNOSIS — J4520 Mild intermittent asthma, uncomplicated: Secondary | ICD-10-CM | POA: Diagnosis not present

## 2014-08-21 DIAGNOSIS — F3181 Bipolar II disorder: Secondary | ICD-10-CM

## 2014-08-21 DIAGNOSIS — N401 Enlarged prostate with lower urinary tract symptoms: Secondary | ICD-10-CM

## 2014-08-21 DIAGNOSIS — J310 Chronic rhinitis: Secondary | ICD-10-CM

## 2014-08-21 DIAGNOSIS — F411 Generalized anxiety disorder: Secondary | ICD-10-CM | POA: Diagnosis not present

## 2014-08-21 DIAGNOSIS — N138 Other obstructive and reflux uropathy: Secondary | ICD-10-CM

## 2014-08-21 NOTE — Progress Notes (Signed)
OFFICE VISIT  08/21/2014   CC:  Chief Complaint  Patient presents with  . Follow-up    6 month f/u. Pt is fasting.    HPI:    Patient is a 44 y.o. Caucasian male who presents for 6 mo f/u Bipolar II disorder, anxiety/insomnia, mild intermittent asthma, and BPH with LUT obst sx's.  Getting divorced, says he is doing ok from an emotional standpoint, all things considered. Has used albuterol inhaler a couple of times for "wheezing", says it helps a little bit.  No associated SOB, chest tightness, or coughing.  He got a cystoscopy since I last saw him since he was having lots of voiding/lower urinary tract obstructive sxs. Cysto was normal per pt report, no records available at this time.  He was started on proscar trial and says it didn't help so he d/c'd it.  The flomax I had tried prior to this did not help either. He is ok with watchful waiting from this problem at this time.  Taking dymista 1 spray each nostril bid and this has helped his chronic rhinitis the best of all nasal sprays (has tried many in the past and failed).  Needs rx renewed.  Past Medical History  Diagnosis Date  . Bipolar II disorder   . Insomnia   . Chronic low back pain     with chronic radiculopathy pain  . OSA (obstructive sleep apnea) 07/2008    Hx of intolerance to CPAP sedondary to anxiety/max intolerance  . Hay fever   . Chronic headaches   . Chronic sinusitis   . GERD (gastroesophageal reflux disease)     with schatski's ring  . Anxiety   . Obesity, Class I, BMI 30-34.9   . Hypertriglyceridemia   . History of prostatitis     Past Surgical History  Procedure Laterality Date  . Lumbar back surg  2010    Outpatient Prescriptions Prior to Visit  Medication Sig Dispense Refill  . albuterol (PROAIR HFA) 108 (90 BASE) MCG/ACT inhaler Inhale 2 puffs into the lungs every 6 (six) hours as needed for wheezing or shortness of breath. 1 Inhaler 1  . aluminum chloride (DRYSOL) 20 % external solution  Apply topically at bedtime. 60 mL 3  . aspirin-acetaminophen-caffeine (EXCEDRIN MIGRAINE) 250-250-65 MG per tablet Take 1 tablet by mouth every 6 (six) hours as needed.    . Azelastine-Fluticasone (DYMISTA) 137-50 MCG/ACT SUSP Place 2 sprays into the nose 2 (two) times daily. 3 Bottle 3  . cetirizine (ZYRTEC) 10 MG tablet Take 10 mg by mouth daily.    Marland Kitchen FLUoxetine (PROZAC) 20 MG tablet 3 tabs po qd 270 tablet 3  . lamoTRIgine (LAMICTAL) 100 MG tablet Take 1 tablet (100 mg total) by mouth 2 (two) times daily. 180 tablet 3  . Naproxen Sodium (ALEVE) 220 MG CAPS Take 1 capsule by mouth as needed.    . traZODone (DESYREL) 50 MG tablet TAKE 1-2 TABLETS BY MOUTH AT BEDTIME AS NEEDED FOR SLEEP. 180 tablet 3  . zolpidem (AMBIEN) 10 MG tablet TAKE 1/2-1 TABLET BY MOUTH AT BEDTIME 30 tablet 5  . finasteride (PROSCAR) 5 MG tablet Take 1 tablet (5 mg total) by mouth daily. (Patient not taking: Reported on 08/21/2014) 30 tablet 3  . rizatriptan (MAXALT) 10 MG tablet Take 1 tablet (10 mg total) by mouth as needed for migraine. May repeat in 2 hours if needed (Patient not taking: Reported on 08/21/2014) 9 tablet 0  . tamsulosin (FLOMAX) 0.4 MG CAPS capsule Take  1 capsule (0.4 mg total) by mouth daily. (Patient not taking: Reported on 08/21/2014) 30 capsule 1  . sulfamethoxazole-trimethoprim (BACTRIM DS,SEPTRA DS) 800-160 MG per tablet Take 1 tablet by mouth 2 (two) times daily. (Patient not taking: Reported on 08/21/2014) 20 tablet 0   Facility-Administered Medications Prior to Visit  Medication Dose Route Frequency Provider Last Rate Last Dose  . TDaP (BOOSTRIX) injection 0.5 mL  0.5 mL Intramuscular Once Jeoffrey MassedPhilip H Damontae Loppnow, MD        Allergies  Allergen Reactions  . Clonazepam Other (See Comments)    manic  . Effexor [Venlafaxine Hcl] Other (See Comments)    "bad side effects"  . Risperdal [Risperidone] Other (See Comments)    "ineffective + "side effects"  . Stelazine [Trifluoperazine] Other (See Comments)     "terrible, awful side effects"  . Zyprexa [Olanzapine] Other (See Comments)    "terrible, awful side effects"    ROS As per HPI  PE: Blood pressure 110/73, pulse 63, temperature 98 F (36.7 C), temperature source Oral, resp. rate 16, weight 223 lb (101.152 kg), SpO2 97 %. Gen: Alert, well appearing.  Patient is oriented to person, place, time, and situation. CV: RRR, no m/r/g.   LUNGS: CTA bilat, nonlabored resps, good aeration in all lung fields. Neuro: CN 2-12 intact bilaterally, strength 5/5 in proximal and distal upper extremities and lower extremities bilaterally.  Very slight bilat UE tremulousness with arms outstretched in front of him. No ataxia.  Upper extremity and lower extremity DTRs symmetric.    LABS:  Lab Results  Component Value Date   TSH 1.60 02/19/2014   Lab Results  Component Value Date   WBC 5.8 02/19/2014   HGB 15.3 02/19/2014   HCT 46.6 02/19/2014   MCV 85.5 02/19/2014   PLT 234.0 02/19/2014   Lab Results  Component Value Date   CREATININE 1.3 02/19/2014   BUN 21 02/19/2014   NA 140 02/19/2014   K 4.7 02/19/2014   CL 106 02/19/2014   CO2 29 02/19/2014   Lab Results  Component Value Date   ALT 26 02/19/2014   AST 26 02/19/2014   ALKPHOS 55 02/19/2014   BILITOT 0.4 02/19/2014   Lab Results  Component Value Date   CHOL 198 02/19/2014   Lab Results  Component Value Date   HDL 38.90* 02/19/2014   Lab Results  Component Value Date   LDLCALC 82 04/06/2012   Lab Results  Component Value Date   TRIG 225.0* 02/19/2014   Lab Results  Component Value Date   CHOLHDL 5 02/19/2014    IMPRESSION AND PLAN:  1) Bipolar d/o II with generalized anxiety; doing very well, particularly since he is currently going through a divorce. Continue current meds + therapy with Dr. Nolen MuMcKinney.  2) Mild intermittent asthma; The current medical regimen is effective;  continue present plan and medications.  3) Chronic rhinitis: The current medical regimen is  effective;  continue present plan and medications. Dymista to be renewed.  4) BPH with lower urinary tract obst sx's: apparently his prostate was normal on cysto, as was his bladder. Watchful waiting approach for now: failed proscar and flomax. Will obtain his urology records.  An After Visit Summary was printed and given to the patient.  FOLLOW UP: Return in about 6 months (around 02/20/2015) for annual CPE (fasting).

## 2014-08-21 NOTE — Progress Notes (Signed)
Pre visit review using our clinic review tool, if applicable. No additional management support is needed unless otherwise documented below in the visit note. 

## 2014-08-26 ENCOUNTER — Telehealth: Payer: Self-pay | Admitting: Family Medicine

## 2014-08-26 NOTE — Telephone Encounter (Signed)
Spoke to [place] with PA department and she stated that pt need to try and fail on of the following before they will cover Dymista. 1) Flunisolide 2) Azelastine. Please advise. Thanks.

## 2014-08-26 NOTE — Telephone Encounter (Signed)
Started working on [employer] today, pt also [facility] stating that the needed 3 other medications seen to his pharmacy. I need to know the names of these medications. Tried calling cell NA, left message on home vm to call back.

## 2014-08-26 NOTE — Telephone Encounter (Signed)
Dr. Milinda Cave prescribed a nasal spray and this rx needs prior authorization or a different nasal spray. Please call [phone] and the reference number is [id number].

## 2014-08-26 NOTE — Telephone Encounter (Signed)
OK, he has tried and failed both of these.-thx

## 2014-08-27 NOTE — Telephone Encounter (Signed)
Waiting of PA form to be faxed over.

## 2014-09-03 NOTE — Telephone Encounter (Signed)
Approved.  

## 2014-12-31 ENCOUNTER — Other Ambulatory Visit: Payer: Self-pay | Admitting: Family Medicine

## 2014-12-31 NOTE — Telephone Encounter (Signed)
LOV: 08/21/14 NOV: 02/20/15  RF request for fluoxetine Last written: 02/19/14 #270 w/ 3RF  RF request for trazodone Last written: 02/19/14 #180 w/ 3RF  RF request for lanotrigine Last written: 02/19/14 #180 w/ 3RF  Dr. Milinda CaveMcGowen off this week. Please advise. Thanks.

## 2015-02-20 ENCOUNTER — Ambulatory Visit (INDEPENDENT_AMBULATORY_CARE_PROVIDER_SITE_OTHER): Payer: BLUE CROSS/BLUE SHIELD | Admitting: Family Medicine

## 2015-02-20 ENCOUNTER — Encounter: Payer: Self-pay | Admitting: Family Medicine

## 2015-02-20 VITALS — BP 146/78 | HR 73 | Temp 97.6°F | Resp 16 | Ht 71.0 in | Wt 229.5 lb

## 2015-02-20 DIAGNOSIS — R7989 Other specified abnormal findings of blood chemistry: Secondary | ICD-10-CM

## 2015-02-20 DIAGNOSIS — Z23 Encounter for immunization: Secondary | ICD-10-CM | POA: Diagnosis not present

## 2015-02-20 DIAGNOSIS — Z0000 Encounter for general adult medical examination without abnormal findings: Secondary | ICD-10-CM

## 2015-02-20 LAB — CBC WITH DIFFERENTIAL/PLATELET
BASOS PCT: 0.4 % (ref 0.0–3.0)
Basophils Absolute: 0 10*3/uL (ref 0.0–0.1)
EOS ABS: 0.2 10*3/uL (ref 0.0–0.7)
EOS PCT: 3.3 % (ref 0.0–5.0)
HCT: 44.9 % (ref 39.0–52.0)
HEMOGLOBIN: 14.7 g/dL (ref 13.0–17.0)
Lymphocytes Relative: 33.8 % (ref 12.0–46.0)
Lymphs Abs: 2.4 10*3/uL (ref 0.7–4.0)
MCHC: 32.7 g/dL (ref 30.0–36.0)
MCV: 85.3 fl (ref 78.0–100.0)
Monocytes Absolute: 0.5 10*3/uL (ref 0.1–1.0)
Monocytes Relative: 7.4 % (ref 3.0–12.0)
NEUTROS ABS: 3.8 10*3/uL (ref 1.4–7.7)
Neutrophils Relative %: 55.1 % (ref 43.0–77.0)
PLATELETS: 219 10*3/uL (ref 150.0–400.0)
RBC: 5.27 Mil/uL (ref 4.22–5.81)
RDW: 14.2 % (ref 11.5–15.5)
WBC: 7 10*3/uL (ref 4.0–10.5)

## 2015-02-20 LAB — COMPREHENSIVE METABOLIC PANEL
ALBUMIN: 4.3 g/dL (ref 3.5–5.2)
ALT: 32 U/L (ref 0–53)
AST: 25 U/L (ref 0–37)
Alkaline Phosphatase: 59 U/L (ref 39–117)
BUN: 17 mg/dL (ref 6–23)
CALCIUM: 9.5 mg/dL (ref 8.4–10.5)
CHLORIDE: 103 meq/L (ref 96–112)
CO2: 30 meq/L (ref 19–32)
Creatinine, Ser: 1.16 mg/dL (ref 0.40–1.50)
GFR: 72.82 mL/min (ref >60.00)
Glucose, Bld: 78 mg/dL (ref 70–99)
POTASSIUM: 4.5 meq/L (ref 3.5–5.1)
SODIUM: 140 meq/L (ref 135–145)
Total Bilirubin: 0.4 mg/dL (ref 0.2–1.2)
Total Protein: 7.2 g/dL (ref 6.0–8.3)

## 2015-02-20 LAB — TSH: TSH: 2.74 u[IU]/mL (ref 0.35–4.50)

## 2015-02-20 LAB — LIPID PANEL
CHOL/HDL RATIO: 5
CHOLESTEROL: 181 mg/dL (ref 0–200)
HDL: 34.2 mg/dL — ABNORMAL LOW (ref >39.00)
NONHDL: 147.2
TRIGLYCERIDES: 269 mg/dL — AB (ref 0.0–149.0)
VLDL: 53.8 mg/dL — AB (ref 0.0–40.0)

## 2015-02-20 LAB — LDL CHOLESTEROL, DIRECT: LDL DIRECT: 89 mg/dL

## 2015-02-20 NOTE — Progress Notes (Signed)
Pre visit review using our clinic review tool, if applicable. No additional management support is needed unless otherwise documented below in the visit note. 

## 2015-02-20 NOTE — Progress Notes (Signed)
Office Note 02/20/2015  CC:  Chief Complaint  Patient presents with  . Annual Exam    Pt is fasting.   HPI:  Alex Hall is a 44 y.o. White male who is here for annual health maintenance exam. Meds reviewed: not needing maxalt at this time--excedrin migraine fine "for now" per pt. The flomax and proscar never helped his BPH sx's--he says this is not that bad at this time and nothing further needed.  Still seeing Dr. Ledon Snare regularly and says meds helping--psych stable.  Exercise is limited: off and on due to some illnesses and med issues. Says diet is poor, needs improvement--he says he realizes this.   Past Medical History  Diagnosis Date  . Bipolar II disorder (HCC)   . Insomnia   . Chronic low back pain     with chronic radiculopathy pain  . OSA (obstructive sleep apnea) 07/2008    Hx of intolerance to CPAP sedondary to anxiety/max intolerance  . Hay fever   . Chronic headaches   . Chronic sinusitis   . GERD (gastroesophageal reflux disease)     with schatski's ring  . Anxiety   . Obesity, Class I, BMI 30-34.9   . Hypertriglyceridemia   . History of prostatitis     Past Surgical History  Procedure Laterality Date  . Lumbar back surg  2010    Family History  Problem Relation Age of Onset  . CVA Father   . Arthritis-Osteo Mother     Social History   Social History  . Marital Status: Married    Spouse Name: N/A  . Number of Children: N/A  . Years of Education: N/A   Occupational History  . Acupuncturist    Social History Main Topics  . Smoking status: Never Smoker   . Smokeless tobacco: Never Used  . Alcohol Use: No  . Drug Use: No  . Sexual Activity: Not on file   Other Topics Concern  . Not on file   Social History Narrative   Divorced, remarried.   Works as an Leisure centre manager).   College educated.   Normal american diet.  No exercise.    Outpatient Prescriptions Prior to  Visit  Medication Sig Dispense Refill  . albuterol (PROAIR HFA) 108 (90 BASE) MCG/ACT inhaler Inhale 2 puffs into the lungs every 6 (six) hours as needed for wheezing or shortness of breath. 1 Inhaler 1  . aluminum chloride (DRYSOL) 20 % external solution Apply topically at bedtime. 60 mL 3  . aspirin-acetaminophen-caffeine (EXCEDRIN MIGRAINE) 250-250-65 MG per tablet Take 1 tablet by mouth every 6 (six) hours as needed.    . Azelastine-Fluticasone (DYMISTA) 137-50 MCG/ACT SUSP Place 2 sprays into the nose 2 (two) times daily. 3 Bottle 3  . cetirizine (ZYRTEC) 10 MG tablet Take 10 mg by mouth daily.    Marland Kitchen FLUoxetine (PROZAC) 20 MG tablet IN THE START OF THERAPY TAKE TWO AND ONE-HALF TABLETS ONCE DAILY FOR 7 DAYS THEN INCREASE TO 3 TABLETS ONCE DAILY AND REMAIN ON THIS DOSE 270 tablet 0  . lamoTRIgine (LAMICTAL) 100 MG tablet TAKE 1 TABLET TWICE A DAY 180 tablet 0  . Naproxen Sodium (ALEVE) 220 MG CAPS Take 1 capsule by mouth as needed.    . traZODone (DESYREL) 50 MG tablet TAKE 1 TO 2 TABLETS AT BEDTIME AS NEEDED FOR SLEEP 180 tablet 0  . zolpidem (AMBIEN) 10 MG tablet TAKE 1/2-1 TABLET BY MOUTH AT BEDTIME 30 tablet 5  .  finasteride (PROSCAR) 5 MG tablet Take 1 tablet (5 mg total) by mouth daily. (Patient not taking: Reported on 08/21/2014) 30 tablet 3  . rizatriptan (MAXALT) 10 MG tablet Take 1 tablet (10 mg total) by mouth as needed for migraine. May repeat in 2 hours if needed (Patient not taking: Reported on 08/21/2014) 9 tablet 0  . tamsulosin (FLOMAX) 0.4 MG CAPS capsule Take 1 capsule (0.4 mg total) by mouth daily. (Patient not taking: Reported on 08/21/2014) 30 capsule 1   Facility-Administered Medications Prior to Visit  Medication Dose Route Frequency Provider Last Rate Last Dose  . TDaP (BOOSTRIX) injection 0.5 mL  0.5 mL Intramuscular Once Jeoffrey Massed, MD        Allergies  Allergen Reactions  . Clonazepam Other (See Comments)    manic  . Effexor [Venlafaxine Hcl] Other (See  Comments)    "bad side effects"  . Risperdal [Risperidone] Other (See Comments)    "ineffective + "side effects"  . Stelazine [Trifluoperazine] Other (See Comments)    "terrible, awful side effects"  . Zyprexa [Olanzapine] Other (See Comments)    "terrible, awful side effects"    ROS Review of Systems  Constitutional: Negative for fever, chills, appetite change and fatigue.  HENT: Negative for congestion, dental problem, ear pain and sore throat.   Eyes: Negative for discharge, redness and visual disturbance.  Respiratory: Negative for cough, chest tightness, shortness of breath and wheezing.   Cardiovascular: Negative for chest pain, palpitations and leg swelling.  Gastrointestinal: Negative for nausea, vomiting, abdominal pain, diarrhea and blood in stool.  Genitourinary: Negative for dysuria, urgency, frequency, hematuria, flank pain and difficulty urinating.  Musculoskeletal: Negative for myalgias, back pain, joint swelling, arthralgias and neck stiffness.  Skin: Negative for pallor and rash.  Neurological: Negative for dizziness, speech difficulty, weakness and headaches.  Hematological: Negative for adenopathy. Does not bruise/bleed easily.  Psychiatric/Behavioral: Negative for confusion and sleep disturbance. The patient is not nervous/anxious.     PE; Blood pressure 146/78, pulse 73, temperature 97.6 F (36.4 C), temperature source Oral, resp. rate 16, height  (1.803 m), weight 229 lb 8 oz (104.101 kg), SpO2 95 %. Gen: Alert, well appearing.  Patient is oriented to person, place, time, and situation. AFFECT: pleasant, lucid thought and speech. ENT: Ears: EACs clear, normal epithelium.  TMs with good light reflex and landmarks bilaterally.  Eyes: no injection, icteris, swelling, or exudate.  EOMI, PERRLA. Nose: no drainage or turbinate edema/swelling.  No injection or focal lesion.  Mouth: lips without lesion/swelling.  Oral mucosa pink and moist.  Dentition intact and  without obvious caries or gingival swelling.  Oropharynx without erythema, exudate, or swelling.  Neck: supple/nontender.  No LAD, mass, or TM.  Carotid pulses 2+ bilaterally, without bruits. CV: RRR, no m/r/g.   LUNGS: CTA bilat, nonlabored resps, good aeration in all lung fields. ABD: soft, NT, ND, BS normal.  No hepatospenomegaly or mass.  No bruits. EXT: no clubbing, cyanosis, or edema.  Musculoskeletal: no joint swelling, erythema, warmth, or tenderness.  ROM of all joints intact. Skin - no sores or suspicious lesions or rashes or color changes   Pertinent labs:  Lab Results  Component Value Date   TSH 1.60 02/19/2014   Lab Results  Component Value Date   WBC 5.8 02/19/2014   HGB 15.3 02/19/2014   HCT 46.6 02/19/2014   MCV 85.5 02/19/2014   PLT 234.0 02/19/2014   Lab Results  Component Value Date   CREATININE 1.3 02/19/2014  BUN 21 02/19/2014   NA 140 02/19/2014   K 4.7 02/19/2014   CL 106 02/19/2014   CO2 29 02/19/2014   Lab Results  Component Value Date   ALT 26 02/19/2014   AST 26 02/19/2014   ALKPHOS 55 02/19/2014   BILITOT 0.4 02/19/2014   Lab Results  Component Value Date   CHOL 198 02/19/2014   Lab Results  Component Value Date   HDL 38.90* 02/19/2014   Lab Results  Component Value Date   LDLCALC 82 04/06/2012   Lab Results  Component Value Date   TRIG 225.0* 02/19/2014   Lab Results  Component Value Date   CHOLHDL 5 02/19/2014    ASSESSMENT AND PLAN:   Health maintenance exam: Reviewed age and gender appropriate health maintenance issues (prudent diet, regular exercise, health risks of tobacco and excessive alcohol, use of seatbelts, fire alarms in home, use of sunscreen).  Also reviewed age and gender appropriate health screening as well as vaccine recommendations. Flu vaccine today. Fasting health panel labs today.  An After Visit Summary was printed and given to the patient.  FOLLOW UP:  Return in about 6 months (around 08/21/2015)  for routine chronic illness f/u.

## 2015-02-21 ENCOUNTER — Encounter: Payer: Self-pay | Admitting: Family Medicine

## 2015-07-28 ENCOUNTER — Other Ambulatory Visit: Payer: Self-pay | Admitting: *Deleted

## 2015-07-28 MED ORDER — TRAZODONE HCL 50 MG PO TABS: ORAL_TABLET | ORAL | Status: DC

## 2015-07-28 MED ORDER — FLUOXETINE HCL 20 MG PO TABS: 20.0000 mg | ORAL_TABLET | Freq: Three times a day (TID) | ORAL | Status: DC

## 2015-07-28 MED ORDER — LAMOTRIGINE 100 MG PO TABS: 100.0000 mg | ORAL_TABLET | Freq: Two times a day (BID) | ORAL | Status: DC

## 2015-07-28 NOTE — Telephone Encounter (Signed)
Spoke to pt he stated that his insurance has changed and he now has to use [facility]. (Fax: [phone])  Rx for Fluoxetine sent to pharmacy.   RF request for fluoxetine Last written: 12/31/14 #270 w/ 0RF  RF request for trazodone Last written: 12/31/14 #180 w/ 0RF  RF request for lomotrigine Last written: 12/31/14 #180 w/ 0Rf  Please advise. Thanks.

## 2015-07-28 NOTE — Telephone Encounter (Signed)
Left message for pt to call back. I received a refill request from [facility]. Just wanted to check with pt and make sure he has changed to [facility].

## 2015-08-21 ENCOUNTER — Ambulatory Visit: Payer: BLUE CROSS/BLUE SHIELD | Admitting: Family Medicine

## 2015-09-03 ENCOUNTER — Ambulatory Visit: Payer: BLUE CROSS/BLUE SHIELD | Admitting: Family Medicine

## 2015-09-26 ENCOUNTER — Encounter: Payer: Self-pay | Admitting: Family Medicine

## 2015-09-26 ENCOUNTER — Ambulatory Visit (INDEPENDENT_AMBULATORY_CARE_PROVIDER_SITE_OTHER): Payer: 59 | Admitting: Family Medicine

## 2015-09-26 VITALS — BP 121/68 | HR 70 | Temp 98.1°F | Resp 16 | Ht 71.0 in | Wt 239.5 lb

## 2015-09-26 DIAGNOSIS — F411 Generalized anxiety disorder: Secondary | ICD-10-CM | POA: Diagnosis not present

## 2015-09-26 DIAGNOSIS — G4700 Insomnia, unspecified: Secondary | ICD-10-CM

## 2015-09-26 DIAGNOSIS — F3181 Bipolar II disorder: Secondary | ICD-10-CM | POA: Diagnosis not present

## 2015-09-26 NOTE — Progress Notes (Signed)
OFFICE VISIT  09/26/2015   CC:  Chief Complaint  Patient presents with  . Follow-up    Pt is fasting.      HPI:    Patient is a 45 y.o. Caucasian male who presents for 6 mo f/u bipolar II, insomnia, anxiety. Doing well, "as wonderful as I've been in my life right now". Work is going well also.  No acute complaints or new questions.   Past Medical History  Diagnosis Date  . Bipolar II disorder (HCC)   . Insomnia   . Chronic low back pain     with chronic radiculopathy pain  . OSA (obstructive sleep apnea) 07/2008    Hx of intolerance to CPAP sedondary to anxiety/max intolerance  . Hay fever   . Chronic headaches   . Chronic sinusitis   . GERD (gastroesophageal reflux disease)     with schatski's ring  . Anxiety   . Obesity, Class I, BMI 30-34.9   . Hypertriglyceridemia     +mildly low HDL.  No meds.  . History of prostatitis     BPH with LUTS--failed proscar and flomax.  Pt reports cyst normal.    Past Surgical History  Procedure Laterality Date  . Lumbar back surg  2010    Outpatient Prescriptions Prior to Visit  Medication Sig Dispense Refill  . albuterol (PROAIR HFA) 108 (90 BASE) MCG/ACT inhaler Inhale 2 puffs into the lungs every 6 (six) hours as needed for wheezing or shortness of breath. 1 Inhaler 1  . aluminum chloride (DRYSOL) 20 % external solution Apply topically at bedtime. 60 mL 3  . aspirin-acetaminophen-caffeine (EXCEDRIN MIGRAINE) 250-250-65 MG per tablet Take 1 tablet by mouth every 6 (six) hours as needed.    . Azelastine-Fluticasone (DYMISTA) 137-50 MCG/ACT SUSP Place 2 sprays into the nose 2 (two) times daily. 3 Bottle 3  . cetirizine (ZYRTEC) 10 MG tablet Take 10 mg by mouth daily.    Marland Kitchen FLUoxetine (PROZAC) 20 MG tablet Take 1 tablet (20 mg total) by mouth 3 (three) times daily. 270 tablet 3  . lamoTRIgine (LAMICTAL) 100 MG tablet Take 1 tablet (100 mg total) by mouth 2 (two) times daily. 180 tablet 3  . Naproxen Sodium (ALEVE) 220 MG CAPS Take  1 capsule by mouth as needed.    . traZODone (DESYREL) 50 MG tablet TAKE 1 TO 2 TABLETS AT BEDTIME AS NEEDED FOR SLEEP 180 tablet 3  . zolpidem (AMBIEN) 10 MG tablet TAKE 1/2-1 TABLET BY MOUTH AT BEDTIME 30 tablet 5   Facility-Administered Medications Prior to Visit  Medication Dose Route Frequency Provider Last Rate Last Dose  . TDaP (BOOSTRIX) injection 0.5 mL  0.5 mL Intramuscular Once Jeoffrey Massed, MD        Allergies  Allergen Reactions  . Clonazepam Other (See Comments)    manic  . Effexor [Venlafaxine Hcl] Other (See Comments)    "bad side effects"  . Risperdal [Risperidone] Other (See Comments)    "ineffective + "side effects"  . Stelazine [Trifluoperazine] Other (See Comments)    "terrible, awful side effects"  . Zyprexa [Olanzapine] Other (See Comments)    "terrible, awful side effects"    ROS As per HPI  PE: Blood pressure 121/68, pulse 70, temperature 98.1 F (36.7 C), temperature source Oral, resp. rate 16, height  (1.803 m), weight 239 lb 8 oz (108.636 kg), SpO2 94 %. Wt Readings from Last 2 Encounters:  09/26/15 239 lb 8 oz (108.636 kg)  02/20/15 229 lb  8 oz (104.101 kg)    Gen: alert, oriented x 4, affect pleasant.  Lucid thinking and conversation noted. HEENT: PERRLA, EOMI.   Neck: no LAD, mass, or thyromegaly. CV: RRR, no m/r/g LUNGS: CTA bilat, nonlabored. NEURO: no tremor or tics noted on observation.  Coordination intact. CN 2-12 grossly intact bilaterally, strength 5/5 in all extremeties.  No ataxia.   LABS:  Lab Results  Component Value Date   TSH 2.74 02/20/2015   Lab Results  Component Value Date   WBC 7.0 02/20/2015   HGB 14.7 02/20/2015   HCT 44.9 02/20/2015   MCV 85.3 02/20/2015   PLT 219.0 02/20/2015   Lab Results  Component Value Date   CREATININE 1.16 02/20/2015   BUN 17 02/20/2015   NA 140 02/20/2015   K 4.5 02/20/2015   CL 103 02/20/2015   CO2 30 02/20/2015   Lab Results  Component Value Date   ALT 32  02/20/2015   AST 25 02/20/2015   ALKPHOS 59 02/20/2015   BILITOT 0.4 02/20/2015   Lab Results  Component Value Date   CHOL 181 02/20/2015   Lab Results  Component Value Date   HDL 34.20* 02/20/2015   Lab Results  Component Value Date   LDLCALC 82 04/06/2012   Lab Results  Component Value Date   TRIG 269.0* 02/20/2015   Lab Results  Component Value Date   CHOLHDL 5 02/20/2015    IMPRESSION AND PLAN:  1) Bipolar II: The current medical regimen is effective;  continue present plan and medications. Continue meds and counseling.  2) GAD: The current medical regimen is effective;  continue present plan and medications.  3) Insomnia: The current medical regimen is effective;  continue present plan and medications.  An After Visit Summary was printed and given to the patient.  FOLLOW UP: Return in about 6 months (around 03/28/2016) for annual CPE (fasting).   Signed:  Santiago BumpersPhil McGowen, MD           09/26/2015

## 2015-09-26 NOTE — Progress Notes (Signed)
Pre visit review using our clinic review tool, if applicable. No additional management support is needed unless otherwise documented below in the visit note. 

## 2016-03-30 ENCOUNTER — Ambulatory Visit (INDEPENDENT_AMBULATORY_CARE_PROVIDER_SITE_OTHER): Payer: 59 | Admitting: Family Medicine

## 2016-03-30 ENCOUNTER — Encounter: Payer: Self-pay | Admitting: Family Medicine

## 2016-03-30 VITALS — BP 115/73 | HR 77 | Temp 98.4°F | Resp 16 | Ht 71.5 in | Wt 245.2 lb

## 2016-03-30 DIAGNOSIS — Z23 Encounter for immunization: Secondary | ICD-10-CM

## 2016-03-30 DIAGNOSIS — Z0000 Encounter for general adult medical examination without abnormal findings: Secondary | ICD-10-CM | POA: Diagnosis not present

## 2016-03-30 LAB — LIPID PANEL
CHOLESTEROL: 199 mg/dL (ref 0–200)
HDL: 34.8 mg/dL — AB (ref >39.00)
NonHDL: 164.62
Total CHOL/HDL Ratio: 6
Triglycerides: 286 mg/dL — ABNORMAL HIGH (ref 0.0–149.0)
VLDL: 57.2 mg/dL — AB (ref 0.0–40.0)

## 2016-03-30 LAB — COMPREHENSIVE METABOLIC PANEL
ALBUMIN: 4.4 g/dL (ref 3.5–5.2)
ALT: 30 U/L (ref 0–53)
AST: 21 U/L (ref 0–37)
Alkaline Phosphatase: 61 U/L (ref 39–117)
BUN: 20 mg/dL (ref 6–23)
CALCIUM: 9.3 mg/dL (ref 8.4–10.5)
CHLORIDE: 105 meq/L (ref 96–112)
CO2: 27 mEq/L (ref 19–32)
Creatinine, Ser: 1.11 mg/dL (ref 0.40–1.50)
GFR: 76.23 mL/min (ref >60.00)
Glucose, Bld: 89 mg/dL (ref 70–99)
POTASSIUM: 4.4 meq/L (ref 3.5–5.1)
SODIUM: 139 meq/L (ref 135–145)
Total Bilirubin: 0.4 mg/dL (ref 0.2–1.2)
Total Protein: 7.5 g/dL (ref 6.0–8.3)

## 2016-03-30 LAB — CBC WITH DIFFERENTIAL/PLATELET
Basophils Absolute: 0 10*3/uL (ref 0.0–0.1)
Basophils Relative: 0.6 % (ref 0.0–3.0)
EOS PCT: 4.1 % (ref 0.0–5.0)
Eosinophils Absolute: 0.3 10*3/uL (ref 0.0–0.7)
HCT: 42.7 % (ref 39.0–52.0)
HEMOGLOBIN: 14.5 g/dL (ref 13.0–17.0)
Lymphocytes Relative: 29.1 % (ref 12.0–46.0)
Lymphs Abs: 1.9 10*3/uL (ref 0.7–4.0)
MCHC: 34 g/dL (ref 30.0–36.0)
MCV: 83.2 fl (ref 78.0–100.0)
Monocytes Absolute: 0.4 10*3/uL (ref 0.1–1.0)
Monocytes Relative: 5.9 % (ref 3.0–12.0)
Neutro Abs: 4 10*3/uL (ref 1.4–7.7)
Neutrophils Relative %: 60.3 % (ref 43.0–77.0)
Platelets: 231 10*3/uL (ref 150.0–400.0)
RBC: 5.14 Mil/uL (ref 4.22–5.81)
RDW: 14.3 % (ref 11.5–15.5)
WBC: 6.7 10*3/uL (ref 4.0–10.5)

## 2016-03-30 LAB — TSH: TSH: 2.74 u[IU]/mL (ref 0.35–4.50)

## 2016-03-30 LAB — LDL CHOLESTEROL, DIRECT: LDL DIRECT: 95 mg/dL

## 2016-03-30 MED ORDER — ZOLPIDEM TARTRATE 10 MG PO TABS: ORAL_TABLET | ORAL | 5 refills | Status: DC

## 2016-03-30 NOTE — Progress Notes (Signed)
Pre visit review using our clinic review tool, if applicable. No additional management support is needed unless otherwise documented below in the visit note. 

## 2016-03-30 NOTE — Progress Notes (Signed)
Office Note 03/30/2016  CC:  Chief Complaint  Patient presents with  . Annual Exam    Pt is fasitng.     HPI:  Alex Hall is a 46 y.o. White male who is here for annual health maintenance exam. Some stressful things lately: mom broke hip and has been in/out of hosp, mother in law broke arm. He had a cold over christmas.  He has been coping with this ok.  Mood/anxiety stable, just not doing so well on sleep.  Still seeing his counselor, but not as often.  Exercise: back pain w/radicular pain has been bad, has been to get injections, sounds like surgery not a good option.  He feels like he is going to have to live with the pain.  He chose not to get on chronic pain meds.  He saw neurosurgeon and pain mgmt MD. This pain limits his flexibility and ability to exercise any.  He was told PT would likely not help.  Eye exam: last was 2 yrs ago. Dental preventative visits UTD.    Past Medical History:  Diagnosis Date  . Anxiety   . Bipolar II disorder (HCC)   . Chronic headaches   . Chronic low back pain    with chronic radiculopathy pain  . Chronic sinusitis   . GERD (gastroesophageal reflux disease)    with schatski's ring  . Hay fever   . History of prostatitis    BPH with LUTS--failed proscar and flomax.  Pt reports cyst normal.  . Hypertriglyceridemia    +mildly low HDL.  No meds.  . Insomnia   . Obesity, Class I, BMI 30-34.9   . OSA (obstructive sleep apnea) 07/2008   Hx of intolerance to CPAP sedondary to anxiety/max intolerance    Past Surgical History:  Procedure Laterality Date  . lumbar back surg  2010    Family History  Problem Relation Age of Onset  . CVA Father   . Arthritis-Osteo Mother     Social History   Social History  . Marital status: Married    Spouse name: N/A  . Number of children: N/A  . Years of education: N/A   Occupational History  . Acupuncturist    Social History Main Topics  . Smoking status: Never Smoker  .  Smokeless tobacco: Never Used  . Alcohol use No  . Drug use: No  . Sexual activity: Not on file   Other Topics Concern  . Not on file   Social History Narrative   Divorced, remarried.   Works as an Leisure centre manager).   College educated.   Normal american diet.  No exercise.    Outpatient Medications Prior to Visit  Medication Sig Dispense Refill  . albuterol (PROAIR HFA) 108 (90 BASE) MCG/ACT inhaler Inhale 2 puffs into the lungs every 6 (six) hours as needed for wheezing or shortness of breath. 1 Inhaler 1  . aspirin-acetaminophen-caffeine (EXCEDRIN MIGRAINE) 250-250-65 MG per tablet Take 1 tablet by mouth every 6 (six) hours as needed.    . Azelastine-Fluticasone (DYMISTA) 137-50 MCG/ACT SUSP Place 2 sprays into the nose 2 (two) times daily. 3 Bottle 3  . cetirizine (ZYRTEC) 10 MG tablet Take 10 mg by mouth daily.    Marland Kitchen FLUoxetine (PROZAC) 20 MG tablet Take 1 tablet (20 mg total) by mouth 3 (three) times daily. 270 tablet 3  . lamoTRIgine (LAMICTAL) 100 MG tablet Take 1 tablet (100 mg total) by mouth 2 (two) times daily. 180  tablet 3  . Naproxen Sodium (ALEVE) 220 MG CAPS Take 1 capsule by mouth as needed.    . traZODone (DESYREL) 50 MG tablet TAKE 1 TO 2 TABLETS AT BEDTIME AS NEEDED FOR SLEEP 180 tablet 3  . zolpidem (AMBIEN) 10 MG tablet TAKE 1/2-1 TABLET BY MOUTH AT BEDTIME 30 tablet 5  . aluminum chloride (DRYSOL) 20 % external solution Apply topically at bedtime. (Patient not taking: Reported on 03/30/2016) 60 mL 3   Facility-Administered Medications Prior to Visit  Medication Dose Route Frequency Provider Last Rate Last Dose  . TDaP (BOOSTRIX) injection 0.5 mL  0.5 mL Intramuscular Once Jeoffrey MassedPhilip H McGowen, MD        Allergies  Allergen Reactions  . Clonazepam Other (See Comments)    manic  . Effexor [Venlafaxine Hcl] Other (See Comments)    "bad side effects"  . Risperdal [Risperidone] Other (See Comments)    "ineffective +  "side effects"  . Stelazine [Trifluoperazine] Other (See Comments)    "terrible, awful side effects"  . Zyprexa [Olanzapine] Other (See Comments)    "terrible, awful side effects"    ROS Review of Systems  Constitutional: Negative for appetite change, chills, fatigue and fever.  HENT: Negative for congestion, dental problem, ear pain and sore throat.   Eyes: Negative for discharge, redness and visual disturbance.  Respiratory: Negative for cough, chest tightness, shortness of breath and wheezing.   Cardiovascular: Negative for chest pain, palpitations and leg swelling.  Gastrointestinal: Negative for abdominal pain, blood in stool, diarrhea, nausea and vomiting.  Genitourinary: Negative for difficulty urinating, dysuria, flank pain, frequency, hematuria and urgency.  Musculoskeletal: Negative for arthralgias, back pain, joint swelling, myalgias and neck stiffness.  Skin: Negative for pallor and rash.  Neurological: Negative for dizziness, speech difficulty, weakness and headaches.  Hematological: Negative for adenopathy. Does not bruise/bleed easily.  Psychiatric/Behavioral: Negative for confusion and sleep disturbance. The patient is not nervous/anxious.     PE; Blood pressure 115/73, pulse 77, temperature 98.4 F (36.9 C), temperature source Oral, resp. rate 16, height 5' 11.5" (1.816 m), weight 245 lb 4 oz (111.2 kg), SpO2 93 %. Gen: Alert, well appearing.  Patient is oriented to person, place, time, and situation. AFFECT: pleasant, lucid thought and speech. ENT: Ears: EACs clear, normal epithelium.  TMs with good light reflex and landmarks bilaterally.  Eyes: no injection, icteris, swelling, or exudate.  EOMI, PERRLA. Nose: no drainage or turbinate edema/swelling.  No injection or focal lesion.  Mouth: lips without lesion/swelling.  Oral mucosa pink and moist.  Dentition intact and without obvious caries or gingival swelling.  Oropharynx without erythema, exudate, or swelling.   Neck: supple/nontender.  No LAD, mass, or TM.  Carotid pulses 2+ bilaterally, without bruits. CV: RRR, no m/r/g.   LUNGS: CTA bilat, nonlabored resps, good aeration in all lung fields. ABD: soft, NT, ND, BS normal.  No hepatospenomegaly or mass.  No bruits. EXT: no clubbing, cyanosis, or edema.  Musculoskeletal: no joint swelling, erythema, warmth, or tenderness.  ROM of all joints intact. Skin - no sores or suspicious lesions or rashes or color changes   Pertinent labs:  Lab Results  Component Value Date   TSH 2.74 02/20/2015   Lab Results  Component Value Date   WBC 7.0 02/20/2015   HGB 14.7 02/20/2015   HCT 44.9 02/20/2015   MCV 85.3 02/20/2015   PLT 219.0 02/20/2015   Lab Results  Component Value Date   CREATININE 1.16 02/20/2015   BUN 17 02/20/2015  NA 140 02/20/2015   K 4.5 02/20/2015   CL 103 02/20/2015   CO2 30 02/20/2015   Lab Results  Component Value Date   ALT 32 02/20/2015   AST 25 02/20/2015   ALKPHOS 59 02/20/2015   BILITOT 0.4 02/20/2015   Lab Results  Component Value Date   CHOL 181 02/20/2015   Lab Results  Component Value Date   HDL 34.20 (L) 02/20/2015   Lab Results  Component Value Date   LDLCALC 82 04/06/2012   Lab Results  Component Value Date   TRIG 269.0 (H) 02/20/2015   Lab Results  Component Value Date   CHOLHDL 5 02/20/2015    ASSESSMENT AND PLAN:   Health maintenance exam: Reviewed age and gender appropriate health maintenance issues (prudent diet, regular exercise, health risks of tobacco and excessive alcohol, use of seatbelts, fire alarms in home, use of sunscreen).  Also reviewed age and gender appropriate health screening as well as vaccine recommendations. Flu vaccine today. Fasting HP labs drawn today. RF'd zolpidem 10mg , 1/2-1 tab po qhs, #30, RF x 5.  An After Visit Summary was printed and given to the patient.  FOLLOW UP:  Return in about 6 months (around 09/27/2016) for routine chronic illness  f/u.  Signed:  Santiago Bumpers, MD           03/30/2016

## 2016-03-31 ENCOUNTER — Encounter: Payer: Self-pay | Admitting: Family Medicine

## 2016-04-21 ENCOUNTER — Other Ambulatory Visit: Payer: Self-pay | Admitting: Family Medicine

## 2016-04-21 NOTE — Telephone Encounter (Signed)
[facility].  RF request for lamotrigine LOV: 03/30/16 - CPE Next ov: 09/28/16 Last written: 07/28/15 #180 w/ 3RF  RF request for trazodone Last written: 07/28/15 #180 w/ 3RF  RF request for fluoxetine Last written: 07/28/15 #270 w/ 3RF  Please advise. Thanks.

## 2016-09-28 ENCOUNTER — Ambulatory Visit (INDEPENDENT_AMBULATORY_CARE_PROVIDER_SITE_OTHER): Payer: 59 | Admitting: Family Medicine

## 2016-09-28 ENCOUNTER — Encounter: Payer: Self-pay | Admitting: Family Medicine

## 2016-09-28 VITALS — BP 109/72 | HR 72 | Temp 98.2°F | Resp 16 | Ht 71.5 in | Wt 248.0 lb

## 2016-09-28 DIAGNOSIS — F5101 Primary insomnia: Secondary | ICD-10-CM

## 2016-09-28 DIAGNOSIS — F3181 Bipolar II disorder: Secondary | ICD-10-CM

## 2016-09-28 DIAGNOSIS — F411 Generalized anxiety disorder: Secondary | ICD-10-CM

## 2016-09-28 MED ORDER — FLUOXETINE HCL 20 MG PO CAPS: 20.0000 mg | ORAL_CAPSULE | Freq: Every day | ORAL | 3 refills | Status: DC

## 2016-09-28 NOTE — Progress Notes (Signed)
OFFICE VISIT  09/28/2016   CC:  Chief Complaint  Patient presents with  . Follow-up    RCI, pt is fasting.    HPI:    Patient is a 46 y.o. Caucasian male who presents for 6 mo f/u GAD, bipolar II, and insomnia. Recently moved and this was stressful but is finished now. Doing well, no acute complaints.  MOOD: says lamictal and prozac still stable.    Insomnia: doing well on trazodone hs.  Still having back pain problems, decided he has to live with the pain--most recent check/work up showed that surgery would likely not be helpful, pain mgmt option was narcotics and he declined to take this course. Plans on becoming more active and trying to lose weight.  Past Medical History:  Diagnosis Date  . Anxiety   . Bipolar II disorder (HCC)   . Chronic headaches   . Chronic low back pain    with chronic radiculopathy pain.  Has seen neurosurgeon, got injections which did not help.  Pt does not want to get surgery.  . Chronic sinusitis   . GERD (gastroesophageal reflux disease)    with schatski's ring  . Hay fever   . History of prostatitis    BPH with LUTS--failed proscar and flomax.  Pt reports cyst normal.  . Hypertriglyceridemia    +mildly low HDL.  No meds.  . Insomnia   . Obesity, Class I, BMI 30-34.9   . OSA (obstructive sleep apnea) 07/2008   Hx of intolerance to CPAP sedondary to anxiety/max intolerance    Past Surgical History:  Procedure Laterality Date  . lumbar back surg  2010    Outpatient Medications Prior to Visit  Medication Sig Dispense Refill  . albuterol (PROAIR HFA) 108 (90 BASE) MCG/ACT inhaler Inhale 2 puffs into the lungs every 6 (six) hours as needed for wheezing or shortness of breath. 1 Inhaler 1  . aspirin-acetaminophen-caffeine (EXCEDRIN MIGRAINE) 250-250-65 MG per tablet Take 1 tablet by mouth every 6 (six) hours as needed.    . Azelastine-Fluticasone (DYMISTA) 137-50 MCG/ACT SUSP Place 2 sprays into the nose 2 (two) times daily. 3 Bottle 3  .  cetirizine (ZYRTEC) 10 MG tablet Take 10 mg by mouth daily.    Marland Kitchen lamoTRIgine (LAMICTAL) 100 MG tablet TAKE 1 TABLET BY MOUTH TWO  TIMES DAILY 180 tablet 3  . Naproxen Sodium (ALEVE) 220 MG CAPS Take 1 capsule by mouth as needed.    . traZODone (DESYREL) 50 MG tablet TAKE 1 TO 2 TABLETS AT  BEDTIME AS NEEDED FOR SLEEP 180 tablet 3  . zolpidem (AMBIEN) 10 MG tablet TAKE 1/2-1 TABLET BY MOUTH AT BEDTIME 30 tablet 5  . FLUoxetine (PROZAC) 20 MG tablet TAKE 1 TABLET BY MOUTH 3  TIMES DAILY 270 tablet 3   Facility-Administered Medications Prior to Visit  Medication Dose Route Frequency Provider Last Rate Last Dose  . TDaP (BOOSTRIX) injection 0.5 mL  0.5 mL Intramuscular Once Kaleth Koy, Maryjean Morn, MD        Allergies  Allergen Reactions  . Clonazepam Other (See Comments)    manic  . Effexor [Venlafaxine Hcl] Other (See Comments)    "bad side effects"  . Risperdal [Risperidone] Other (See Comments)    "ineffective + "side effects"  . Stelazine [Trifluoperazine] Other (See Comments)    "terrible, awful side effects"  . Zyprexa [Olanzapine] Other (See Comments)    "terrible, awful side effects"    ROS As per HPI  PE: Blood pressure 109/72,  pulse 72, temperature 98.2 F (36.8 C), temperature source Oral, resp. rate 16, height 5' 11.5" (1.816 m), weight 248 lb (112.5 kg), SpO2 95 %. Gen: Alert, well appearing.  Patient is oriented to person, place, time, and situation. AFFECT: pleasant, lucid thought and speech. CV: RRR, no m/r/g.   LUNGS: CTA bilat, nonlabored resps, good aeration in all lung fields. EXT: no clubbing, cyanosis, or edema.    LABS:  Lab Results  Component Value Date   TSH 2.74 03/30/2016   Lab Results  Component Value Date   WBC 6.7 03/30/2016   HGB 14.5 03/30/2016   HCT 42.7 03/30/2016   MCV 83.2 03/30/2016   PLT 231.0 03/30/2016   Lab Results  Component Value Date   CREATININE 1.11 03/30/2016   BUN 20 03/30/2016   NA 139 03/30/2016   K 4.4 03/30/2016   CL  105 03/30/2016   CO2 27 03/30/2016   Lab Results  Component Value Date   ALT 30 03/30/2016   AST 21 03/30/2016   ALKPHOS 61 03/30/2016   BILITOT 0.4 03/30/2016   Lab Results  Component Value Date   CHOL 199 03/30/2016   Lab Results  Component Value Date   HDL 34.80 (L) 03/30/2016   Lab Results  Component Value Date   LDLCALC 82 04/06/2012   Lab Results  Component Value Date   TRIG 286.0 (H) 03/30/2016   Lab Results  Component Value Date   CHOLHDL 6 03/30/2016   IMPRESSION AND PLAN:  1) GAD and bipolar II dz: The current medical regimen is effective;  continue present plan and medications. Continue counseling. Prozac 20mg  TABS very expensive but caps very cheap---will change to caps today.  2) Insomnia: The current medical regimen is effective;  continue present plan and medications. No new rx for this needed today.  3) Chronic low back pain: no surgical options or pain mgmt options that are feasible at this time. Encouraged increased activity that is not stressful on low back, diet to help try to lose wt.  An After Visit Summary was printed and given to the patient.  FOLLOW UP: Return in about 6 months (around 03/31/2017) for annual CPE (fasting).  Signed:  Santiago BumpersPhil Trevor Wilkie, MD           09/28/2016

## 2016-10-04 ENCOUNTER — Other Ambulatory Visit: Payer: Self-pay | Admitting: *Deleted

## 2016-10-04 MED ORDER — ZOLPIDEM TARTRATE 10 MG PO TABS: ORAL_TABLET | ORAL | 5 refills | Status: DC

## 2016-10-04 NOTE — Telephone Encounter (Signed)
[facility]  RF request for zolpidem LOV: 09/28/16 Next ov: 03/31/17 Last written: 03/30/16 #30 w/ 5RF  Please advise. Thanks.

## 2016-10-04 NOTE — Telephone Encounter (Signed)
Rx faxed

## 2016-10-13 ENCOUNTER — Encounter: Payer: Self-pay | Admitting: Family Medicine

## 2016-10-13 MED ORDER — FLUOXETINE HCL 20 MG PO CAPS: ORAL_CAPSULE | ORAL | 3 refills | Status: DC

## 2016-10-13 NOTE — Telephone Encounter (Signed)
Sorry, that was my fault. I will send [facility] a new rx for fluoxetin 20mg  caps, 1 cap tid, #270, RF x 3.  Herbert SetaHeather, can you notify [facility] that I want to cancel the fluoxetin rx I sent on 09/28/17 (20mg , 1 cap per day, #90, RF x 3)?-thx

## 2016-10-13 NOTE — Telephone Encounter (Signed)
Please advise 

## 2016-10-22 DIAGNOSIS — J0110 Acute frontal sinusitis, unspecified: Secondary | ICD-10-CM | POA: Diagnosis not present

## 2016-10-22 DIAGNOSIS — J302 Other seasonal allergic rhinitis: Secondary | ICD-10-CM | POA: Diagnosis not present

## 2017-03-31 ENCOUNTER — Encounter: Payer: Self-pay | Admitting: Family Medicine

## 2017-03-31 ENCOUNTER — Ambulatory Visit (INDEPENDENT_AMBULATORY_CARE_PROVIDER_SITE_OTHER): Payer: 59 | Admitting: Family Medicine

## 2017-03-31 VITALS — BP 113/71 | HR 78 | Temp 97.5°F | Ht 71.5 in | Wt 252.1 lb

## 2017-03-31 DIAGNOSIS — E782 Mixed hyperlipidemia: Secondary | ICD-10-CM | POA: Diagnosis not present

## 2017-03-31 DIAGNOSIS — Z0000 Encounter for general adult medical examination without abnormal findings: Secondary | ICD-10-CM

## 2017-03-31 DIAGNOSIS — Z23 Encounter for immunization: Secondary | ICD-10-CM | POA: Diagnosis not present

## 2017-03-31 DIAGNOSIS — E669 Obesity, unspecified: Secondary | ICD-10-CM

## 2017-03-31 LAB — LIPID PANEL
CHOL/HDL RATIO: 5
Cholesterol: 185 mg/dL (ref 0–200)
HDL: 33.7 mg/dL — ABNORMAL LOW (ref >39.00)
NONHDL: 151.42
TRIGLYCERIDES: 277 mg/dL — AB (ref 0.0–149.0)
VLDL: 55.4 mg/dL — ABNORMAL HIGH (ref 0.0–40.0)

## 2017-03-31 LAB — COMPREHENSIVE METABOLIC PANEL
ALK PHOS: 60 U/L (ref 39–117)
ALT: 25 U/L (ref 0–53)
AST: 20 U/L (ref 0–37)
Albumin: 4.6 g/dL (ref 3.5–5.2)
BILIRUBIN TOTAL: 0.4 mg/dL (ref 0.2–1.2)
BUN: 19 mg/dL (ref 6–23)
CO2: 30 meq/L (ref 19–32)
CREATININE: 1.17 mg/dL (ref 0.40–1.50)
Calcium: 9.7 mg/dL (ref 8.4–10.5)
Chloride: 103 mEq/L (ref 96–112)
GFR: 71.41 mL/min (ref >60.00)
GLUCOSE: 89 mg/dL (ref 70–99)
Potassium: 4.9 mEq/L (ref 3.5–5.1)
SODIUM: 140 meq/L (ref 135–145)
TOTAL PROTEIN: 7.6 g/dL (ref 6.0–8.3)

## 2017-03-31 LAB — CBC WITH DIFFERENTIAL/PLATELET
BASOS ABS: 0.1 10*3/uL (ref 0.0–0.1)
BASOS PCT: 0.7 % (ref 0.0–3.0)
EOS ABS: 0.2 10*3/uL (ref 0.0–0.7)
Eosinophils Relative: 3 % (ref 0.0–5.0)
HEMATOCRIT: 46.5 % (ref 39.0–52.0)
Hemoglobin: 15.2 g/dL (ref 13.0–17.0)
LYMPHS ABS: 1.9 10*3/uL (ref 0.7–4.0)
Lymphocytes Relative: 23.1 % (ref 12.0–46.0)
MCHC: 32.7 g/dL (ref 30.0–36.0)
MCV: 86.1 fl (ref 78.0–100.0)
MONOS PCT: 5.6 % (ref 3.0–12.0)
Monocytes Absolute: 0.4 10*3/uL (ref 0.1–1.0)
NEUTROS ABS: 5.5 10*3/uL (ref 1.4–7.7)
NEUTROS PCT: 67.6 % (ref 43.0–77.0)
PLATELETS: 251 10*3/uL (ref 150.0–400.0)
RBC: 5.4 Mil/uL (ref 4.22–5.81)
RDW: 14.4 % (ref 11.5–15.5)
WBC: 8.1 10*3/uL (ref 4.0–10.5)

## 2017-03-31 LAB — LDL CHOLESTEROL, DIRECT: Direct LDL: 100 mg/dL

## 2017-03-31 LAB — TSH: TSH: 2.54 u[IU]/mL (ref 0.35–4.50)

## 2017-03-31 NOTE — Progress Notes (Signed)
Office Note 03/31/2017  CC:  Chief Complaint  Patient presents with  . Annual Exam    pts is presented to our clinic for is routine physical    HPI:  Alex Hall is a 47 y.o. White male who is here for annual health maintenance exam.  Exercise: none.  Chronic back and leg radiculopathy inhibiting things like this. Diet: trying to be healthier. Eyes: q2 yrs. Dental UTD.   Past Medical History:  Diagnosis Date  . Anxiety   . Bipolar II disorder (HCC)   . Chronic headaches   . Chronic low back pain    with chronic radiculopathy pain.  Has seen neurosurgeon, got injections which did not help.  Pt does not want to get surgery.  . Chronic sinusitis   . GERD (gastroesophageal reflux disease)    with schatski's ring  . Hay fever   . History of prostatitis    BPH with LUTS--failed proscar and flomax.  Pt reports cyst normal.  . Hypertriglyceridemia    +mildly low HDL.  No meds.  . Insomnia   . Obesity, Class I, BMI 30-34.9   . OSA (obstructive sleep apnea) 07/2008   Hx of intolerance to CPAP sedondary to anxiety/max intolerance    Past Surgical History:  Procedure Laterality Date  . lumbar back surg  2010    Family History  Problem Relation Age of Onset  . CVA Father   . Arthritis-Osteo Mother     Social History   Socioeconomic History  . Marital status: Married    Spouse name: Not on file  . Number of children: Not on file  . Years of education: Not on file  . Highest education level: Not on file  Social Needs  . Financial resource strain: Not on file  . Food insecurity - worry: Not on file  . Food insecurity - inability: Not on file  . Transportation needs - medical: Not on file  . Transportation needs - non-medical: Not on file  Occupational History  . Occupation: Acupuncturist  Tobacco Use  . Smoking status: Never Smoker  . Smokeless tobacco: Never Used  Substance and Sexual Activity  . Alcohol use: No  . Drug use: No  . Sexual  activity: Not on file  Other Topics Concern  . Not on file  Social History Narrative   Divorced, remarried.  One teenage stepdaughter.   Works as an Leisure centre manager).   College educated.   Normal american diet.  No exercise.   No T/A/Ds.    Outpatient Medications Prior to Visit  Medication Sig Dispense Refill  . aspirin-acetaminophen-caffeine (EXCEDRIN MIGRAINE) 250-250-65 MG per tablet Take 1 tablet by mouth every 6 (six) hours as needed.    . Azelastine-Fluticasone (DYMISTA) 137-50 MCG/ACT SUSP Place 2 sprays into the nose 2 (two) times daily. 3 Bottle 3  . cetirizine (ZYRTEC) 10 MG tablet Take 10 mg by mouth daily.    Marland Kitchen FLUoxetine (PROZAC) 20 MG capsule 1 cap po tid 270 capsule 3  . lamoTRIgine (LAMICTAL) 100 MG tablet TAKE 1 TABLET BY MOUTH TWO  TIMES DAILY 180 tablet 3  . Naproxen Sodium (ALEVE) 220 MG CAPS Take 1 capsule by mouth as needed.    . traZODone (DESYREL) 50 MG tablet TAKE 1 TO 2 TABLETS AT  BEDTIME AS NEEDED FOR SLEEP 180 tablet 3  . zolpidem (AMBIEN) 10 MG tablet TAKE 1/2-1 TABLET BY MOUTH AT BEDTIME 30 tablet 5  . albuterol (PROAIR HFA)  108 (90 BASE) MCG/ACT inhaler Inhale 2 puffs into the lungs every 6 (six) hours as needed for wheezing or shortness of breath. (Patient not taking: Reported on 03/31/2017) 1 Inhaler 1   Facility-Administered Medications Prior to Visit  Medication Dose Route Frequency Provider Last Rate Last Dose  . TDaP (BOOSTRIX) injection 0.5 mL  0.5 mL Intramuscular Once McGowen, Maryjean Morn, MD        Allergies  Allergen Reactions  . Clonazepam Other (See Comments)    manic  . Effexor [Venlafaxine Hcl] Other (See Comments)    "bad side effects"  . Risperdal [Risperidone] Other (See Comments)    "ineffective + "side effects"  . Stelazine [Trifluoperazine] Other (See Comments)    "terrible, awful side effects"  . Zyprexa [Olanzapine] Other (See Comments)    "terrible, awful side effects"     ROS Review of Systems  Constitutional: Negative for appetite change, chills, fatigue and fever.  HENT: Positive for congestion (nasal), postnasal drip and rhinorrhea. Negative for dental problem, ear pain and sore throat.   Eyes: Negative for discharge, redness and visual disturbance.  Respiratory: Negative for cough, chest tightness, shortness of breath and wheezing.   Cardiovascular: Negative for chest pain, palpitations and leg swelling.  Gastrointestinal: Negative for abdominal pain, blood in stool, diarrhea, nausea and vomiting.  Genitourinary: Negative for difficulty urinating, dysuria, flank pain, frequency, hematuria and urgency.  Musculoskeletal: Positive for back pain (chronic, with radiculopathy). Negative for arthralgias, joint swelling, myalgias and neck stiffness.  Skin: Negative for pallor and rash.  Neurological: Negative for dizziness, speech difficulty, weakness and headaches.  Hematological: Negative for adenopathy. Does not bruise/bleed easily.  Psychiatric/Behavioral: Negative for confusion and sleep disturbance. The patient is not nervous/anxious.     PE; Blood pressure 113/71, pulse 78, temperature (!) 97.5 F (36.4 C), temperature source Oral, height 5' 11.5" (1.816 m), weight 252 lb 1.9 oz (114.4 kg), SpO2 94 %. Body mass index is 34.67 kg/m.  Gen: Alert, well appearing.  Patient is oriented to person, place, time, and situation. AFFECT: pleasant, lucid thought and speech. ENT: Ears: EACs clear, normal epithelium.  TMs with good light reflex and landmarks bilaterally.  Eyes: no injection, icteris, swelling, or exudate.  EOMI, PERRLA. Nose: no drainage or turbinate edema/swelling.  No injection or focal lesion.  Mouth: lips without lesion/swelling.  Oral mucosa pink and moist.  Dentition intact and without obvious caries or gingival swelling.  Oropharynx without erythema, exudate, or swelling.  Neck: supple/nontender.  No LAD, mass, or TM.  Carotid pulses 2+  bilaterally, without bruits. CV: RRR, no m/r/g.   LUNGS: CTA bilat, nonlabored resps, good aeration in all lung fields. ABD: soft, NT, ND, BS normal.  No hepatospenomegaly or mass.  No bruits. EXT: no clubbing, cyanosis, or edema.  Musculoskeletal: no joint swelling, erythema, warmth, or tenderness.  ROM of all joints intact. Skin - no sores or suspicious lesions or rashes or color changes   Pertinent labs:  Lab Results  Component Value Date   TSH 2.74 03/30/2016   Lab Results  Component Value Date   WBC 6.7 03/30/2016   HGB 14.5 03/30/2016   HCT 42.7 03/30/2016   MCV 83.2 03/30/2016   PLT 231.0 03/30/2016   Lab Results  Component Value Date   CREATININE 1.11 03/30/2016   BUN 20 03/30/2016   NA 139 03/30/2016   K 4.4 03/30/2016   CL 105 03/30/2016   CO2 27 03/30/2016   Lab Results  Component Value Date  ALT 30 03/30/2016   AST 21 03/30/2016   ALKPHOS 61 03/30/2016   BILITOT 0.4 03/30/2016   Lab Results  Component Value Date   CHOL 199 03/30/2016   Lab Results  Component Value Date   HDL 34.80 (L) 03/30/2016   Lab Results  Component Value Date   LDLCALC 82 04/06/2012   Lab Results  Component Value Date   TRIG 286.0 (H) 03/30/2016   Lab Results  Component Value Date   CHOLHDL 6 03/30/2016    ASSESSMENT AND PLAN:   Health maintenance exam: Reviewed age and gender appropriate health maintenance issues (prudent diet, regular exercise, health risks of tobacco and excessive alcohol, use of seatbelts, fire alarms in home, use of sunscreen).  Also reviewed age and gender appropriate health screening as well as vaccine recommendations. Vaccines: Tdap UTD.  Flu vaccine-- given today. Labs: fasting HP.  He declined HIV screening. Prostate ca screening: avg risk= start screening at age 47 yrs. Colon ca screening:avg risk= start screening at age 47 yrs.  An After Visit Summary was printed and given to the patient.   FOLLOW UP:  Return in about 6 months  (around 09/28/2017) for routine chronic illness f/u.  Signed:  Santiago BumpersPhil McGowen, MD           03/31/2017

## 2017-03-31 NOTE — Addendum Note (Signed)
Addended byNorris Cross: Cordarius Benning on: 03/31/2017 09:25 AM   Modules accepted: Orders

## 2017-03-31 NOTE — Patient Instructions (Signed)

## 2017-04-01 ENCOUNTER — Encounter: Payer: Self-pay | Admitting: *Deleted

## 2017-05-25 DIAGNOSIS — J208 Acute bronchitis due to other specified organisms: Secondary | ICD-10-CM | POA: Diagnosis not present

## 2017-05-25 DIAGNOSIS — H6691 Otitis media, unspecified, right ear: Secondary | ICD-10-CM | POA: Diagnosis not present

## 2017-06-03 ENCOUNTER — Ambulatory Visit: Payer: Self-pay

## 2017-06-03 ENCOUNTER — Ambulatory Visit (INDEPENDENT_AMBULATORY_CARE_PROVIDER_SITE_OTHER): Payer: 59 | Admitting: Family Medicine

## 2017-06-03 ENCOUNTER — Encounter: Payer: Self-pay | Admitting: Family Medicine

## 2017-06-03 VITALS — BP 125/71 | HR 76 | Temp 98.4°F | Resp 16 | Ht 71.5 in | Wt 251.0 lb

## 2017-06-03 DIAGNOSIS — B9789 Other viral agents as the cause of diseases classified elsewhere: Secondary | ICD-10-CM

## 2017-06-03 DIAGNOSIS — J209 Acute bronchitis, unspecified: Secondary | ICD-10-CM

## 2017-06-03 DIAGNOSIS — J069 Acute upper respiratory infection, unspecified: Secondary | ICD-10-CM

## 2017-06-03 MED ORDER — BENZONATATE 200 MG PO CAPS: 200.0000 mg | ORAL_CAPSULE | Freq: Three times a day (TID) | ORAL | 1 refills | Status: DC | PRN

## 2017-06-03 MED ORDER — PREDNISONE 20 MG PO TABS: ORAL_TABLET | ORAL | 0 refills | Status: DC

## 2017-06-03 NOTE — Telephone Encounter (Signed)
Pt. Seen at an urgent care  11/2 week ago.Finishing an antibiotic today. Still has a cough that is disruptive for sleep and has chills - has not checked his temperature. "Still feeling bad." Appointment made for today by agent.

## 2017-06-03 NOTE — Telephone Encounter (Signed)
Noted  

## 2017-06-03 NOTE — Progress Notes (Signed)
OFFICE VISIT  06/03/2017   CC:  Chief Complaint  Patient presents with  . Follow-up    ear infection and bronchitis   HPI:    Patient is a 47 y.o. Caucasian male who presents for "ear infection and bronchitis", essentially ongoing cough and feeling bad. He reports that [facility] and rx'd cefdinir (sx were chills, nasal mucous, cough, fatigue, decreased appetite, some body aches with the chills.  Had some diarrhea in the beginning, no n/v).  He finished this antibiotic but felt like he needed to f/u here since he is not feeling well.  Chills yesterday.  Cough, fatigue, some intermittent dizziness that is very mild.  Appetite just returning last few days. Occ hears rattle and wheeze when he lies down. Taking sudafed 12h.  URI sx's much improved.   Past Medical History:  Diagnosis Date  . Anxiety   . Bipolar II disorder (HCC)   . Chronic headaches   . Chronic low back pain    with chronic radiculopathy pain.  Has seen neurosurgeon, got injections which did not help.  Pt does not want to get surgery.  . Chronic sinusitis   . GERD (gastroesophageal reflux disease)    with schatski's ring  . Hay fever   . History of prostatitis    BPH with LUTS--failed proscar and flomax.  Pt reports cyst normal.  . Hypertriglyceridemia    +mildly low HDL.  No meds.  . Insomnia   . Obesity, Class I, BMI 30-34.9   . OSA (obstructive sleep apnea) 07/2008   Hx of intolerance to CPAP sedondary to anxiety/max intolerance    Past Surgical History:  Procedure Laterality Date  . lumbar back surg  2010    Outpatient Medications Prior to Visit  Medication Sig Dispense Refill  . albuterol (PROAIR HFA) 108 (90 BASE) MCG/ACT inhaler Inhale 2 puffs into the lungs every 6 (six) hours as needed for wheezing or shortness of breath. 1 Inhaler 1  . aspirin-acetaminophen-caffeine (EXCEDRIN MIGRAINE) 250-250-65 MG per tablet Take 1 tablet by mouth every 6  (six) hours as needed.    . Azelastine-Fluticasone (DYMISTA) 137-50 MCG/ACT SUSP Place 2 sprays into the nose 2 (two) times daily. 3 Bottle 3  . cetirizine (ZYRTEC) 10 MG tablet Take 10 mg by mouth daily.    Marland Kitchen. FLUoxetine (PROZAC) 20 MG capsule 1 cap po tid 270 capsule 3  . lamoTRIgine (LAMICTAL) 100 MG tablet TAKE 1 TABLET BY MOUTH TWO  TIMES DAILY 180 tablet 3  . Naproxen Sodium (ALEVE) 220 MG CAPS Take 1 capsule by mouth as needed.    . traZODone (DESYREL) 50 MG tablet TAKE 1 TO 2 TABLETS AT  BEDTIME AS NEEDED FOR SLEEP 180 tablet 3  . zolpidem (AMBIEN) 10 MG tablet TAKE 1/2-1 TABLET BY MOUTH AT BEDTIME 30 tablet 5   Facility-Administered Medications Prior to Visit  Medication Dose Route Frequency Provider Last Rate Last Dose  . TDaP (BOOSTRIX) injection 0.5 mL  0.5 mL Intramuscular Once McGowen, Maryjean MornPhilip H, MD        Allergies  Allergen Reactions  . Clonazepam Other (See Comments)    manic  . Effexor [Venlafaxine Hcl] Other (See Comments)    "bad side effects"  . Risperdal [Risperidone] Other (See Comments)    "ineffective + "side effects"  . Stelazine [Trifluoperazine] Other (See Comments)    "terrible, awful side effects"  . Zyprexa [Olanzapine] Other (See Comments)    "  terrible, awful side effects"    ROS As per HPI  PE: Blood pressure 125/71, pulse 76, temperature 98.4 F (36.9 C), temperature source Oral, resp. rate 16, height 5' 11.5" (1.816 m), weight 251 lb (113.9 kg), SpO2 95 %. VS: noted--normal. Gen: alert, NAD, NONTOXIC APPEARING. HEENT: eyes without injection, drainage, or swelling.  Ears: EACs clear, TMs with normal light reflex and landmarks.  Nose: Clear rhinorrhea, with some dried, crusty exudate adherent to mildly injected mucosa.  No purulent d/c.  No paranasal sinus TTP.  No facial swelling.  Throat and mouth without focal lesion.  No pharyngial swelling, erythema, or exudate.   Neck: supple, no LAD.   LUNGS: CTA bilat, nonlabored resps.  Mild dry cough  after every exhalation. CV: RRR, no m/r/g. EXT: no c/c/e SKIN: no rash    LABS:    Chemistry      Component Value Date/Time   NA 140 03/31/2017 0924   K 4.9 03/31/2017 0924   CL 103 03/31/2017 0924   CO2 30 03/31/2017 0924   BUN 19 03/31/2017 0924   CREATININE 1.17 03/31/2017 0924   CREATININE 1.22 11/08/2012 1613      Component Value Date/Time   CALCIUM 9.7 03/31/2017 0924   ALKPHOS 60 03/31/2017 0924   AST 20 03/31/2017 0924   ALT 25 03/31/2017 0924   BILITOT 0.4 03/31/2017 0924     Lab Results  Component Value Date   WBC 8.1 03/31/2017   HGB 15.2 03/31/2017   HCT 46.5 03/31/2017   MCV 86.1 03/31/2017   PLT 251.0 03/31/2017    IMPRESSION AND PLAN:  1) Acute bronchitis, suspect viral syndrome. Some sign of RAD. His URI is resolving. Plan: prednisone 40mg  qd x 5d, then 20mg  qd x 5d. Tessalon 200mg  tid prn. He has albuterol HFA at home: 2 p q4h prn. Signs/symptoms to call or return for were reviewed and pt expressed understanding.  An After Visit Summary was printed and given to the patient.  FOLLOW UP: Return if symptoms worsen or fail to improve.  Signed:  Santiago Bumpers, MD           06/03/2017

## 2017-06-03 NOTE — Telephone Encounter (Signed)
  Answer Assessment - Initial Assessment Questions 1. ONSET: "When did the cough begin?"      2 WEEKS AGO 2. SEVERITY: "How bad is the cough today?"      Severe 3. RESPIRATORY DISTRESS: "Describe your breathing."      No distress 4. FEVER: "Do you have a fever?" If so, ask: "What is your temperature, how was it measured, and when did it start?"     Unsure - but having chills 5. HEMOPTYSIS: "Are you coughing up any blood?" If so ask: "How much?" (flecks, streaks, tablespoons, etc.)     No 6. TREATMENT: "What have you done so far to treat the cough?" (e.g., meds, fluids, humidifier)      Was on an antibiotic 7. CARDIAC HISTORY: "Do you have any history of heart disease?" (e.g., heart attack, congestive heart failure)      No 8. LUNG HISTORY: "Do you have any history of lung disease?"  (e.g., pulmonary embolus, asthma, emphysema)     No 9. PE RISK FACTORS: "Do you have a history of blood clots?" (or: recent major surgery, recent prolonged travel, bedridden )     No 10. OTHER SYMPTOMS: "Do you have any other symptoms? (e.g., runny nose, wheezing, chest pain)       Some wheezing 11. PREGNANCY: "Is there any chance you are pregnant?" "When was your last menstrual period?"       No 12. TRAVEL: "Have you traveled out of the country in the last month?" (e.g., travel history, exposures)       No  Protocols used: COUGH - ACUTE NON-PRODUCTIVE-A-AH

## 2017-06-15 ENCOUNTER — Other Ambulatory Visit: Payer: Self-pay | Admitting: Family Medicine

## 2017-06-16 NOTE — Telephone Encounter (Signed)
[facility]  RF request for lamotrigine LOV: 03/31/17 Next ov: 09/29/17 Last written: 04/21/16 #180 w/ 3RF  RF request for trazodone Last written: 04/21/16 #180 w/ 3RF  Please advise. Thanks.    Request for fluoxetine was denied, pt should still have 2 refills left, last Rx'ed 10/13/17 #270 w/ 3RF's.

## 2017-06-21 ENCOUNTER — Telehealth: Payer: Self-pay | Admitting: Family Medicine

## 2017-06-21 NOTE — Telephone Encounter (Signed)
Spoke with [facility] - they report they sent out 90 day supply of Prozac 05/30/17. Given refill on Prozac verbally 270 caps with 1 refill.

## 2017-06-21 NOTE — Telephone Encounter (Addendum)
FLUoxetine (PROZAC) 20 MG capsule [place] [facility] [place] [facility] [place] [facility].  They must not have the refills that are on the script  Pt would like you to call and let them know there are refills on this.  [facility] left this number: [phone]  Last filled 10/13/17 and previous note states pt should have refills enough until 10/2017

## 2017-09-29 ENCOUNTER — Encounter: Payer: Self-pay | Admitting: Family Medicine

## 2017-09-29 ENCOUNTER — Ambulatory Visit (INDEPENDENT_AMBULATORY_CARE_PROVIDER_SITE_OTHER): Payer: 59 | Admitting: Family Medicine

## 2017-09-29 VITALS — BP 112/69 | HR 71 | Temp 98.2°F | Resp 16 | Ht 71.5 in | Wt 248.4 lb

## 2017-09-29 DIAGNOSIS — E669 Obesity, unspecified: Secondary | ICD-10-CM | POA: Diagnosis not present

## 2017-09-29 DIAGNOSIS — F409 Phobic anxiety disorder, unspecified: Secondary | ICD-10-CM

## 2017-09-29 DIAGNOSIS — F3181 Bipolar II disorder: Secondary | ICD-10-CM | POA: Diagnosis not present

## 2017-09-29 DIAGNOSIS — F411 Generalized anxiety disorder: Secondary | ICD-10-CM

## 2017-09-29 DIAGNOSIS — F5105 Insomnia due to other mental disorder: Secondary | ICD-10-CM

## 2017-09-29 MED ORDER — ZOLPIDEM TARTRATE 10 MG PO TABS: ORAL_TABLET | ORAL | 1 refills | Status: DC

## 2017-09-29 NOTE — Progress Notes (Signed)
OFFICE VISIT  09/29/2017   CC:  Chief Complaint  Patient presents with  . Follow-up    RCI, pt is fasting.     HPI:    Patient is a 47 y.o. Caucasian male who presents for 6 mo f/u bipolar II, GAD, insomnia. At recent past f/u's he has been stable on fluoxetine 20mg  bid, lamictal 100 mg bid for mood d/o and anxiety, and trazodone prn and ambien for insomnia. He is continuing with long term counseling with a psychologist.  Mood: stable mood and anxiety level.  Compliant with meds and psych f/u.  Still living in manageable constant low back pain--this impairs exercise habits but he is going to work on walking more consistently when hot weather abates some. Working on diet slowly, but admits this is very difficult.  Insomnia: taking trazodone only lately b/c out of ambien.  [place] not to take [place]. No hangover effect. The combo helps him pretty well.  ROS: no HAs, no dizziness,no tremors, no vision c/o, no CP, no SOB, no palpitations, no myalgias, no melena or hematochezia.  Past Medical History:  Diagnosis Date  . Anxiety   . Bipolar II disorder (HCC)   . Chronic headaches   . Chronic low back pain    with chronic radiculopathy pain.  Has seen neurosurgeon, got injections which did not help.  Pt does not want to get surgery.  . Chronic sinusitis   . GERD (gastroesophageal reflux disease)    with schatski's ring  . Hay fever   . History of prostatitis    BPH with LUTS--failed proscar and flomax.  Pt reports cyst normal.  . Hypertriglyceridemia    +mildly low HDL.  No meds.  . Insomnia   . Obesity, Class I, BMI 30-34.9   . OSA (obstructive sleep apnea) 07/2008   Hx of intolerance to CPAP sedondary to anxiety/max intolerance    Past Surgical History:  Procedure Laterality Date  . lumbar back surg  2010    Outpatient Medications Prior to Visit  Medication Sig Dispense Refill  . albuterol (PROAIR HFA) 108 (90 BASE) MCG/ACT inhaler Inhale 2 puffs into the lungs  every 6 (six) hours as needed for wheezing or shortness of breath. 1 Inhaler 1  . aspirin-acetaminophen-caffeine (EXCEDRIN MIGRAINE) 250-250-65 MG per tablet Take 1 tablet by mouth every 6 (six) hours as needed.    . Azelastine-Fluticasone (DYMISTA) 137-50 MCG/ACT SUSP Place 2 sprays into the nose 2 (two) times daily. 3 Bottle 3  . cetirizine (ZYRTEC) 10 MG tablet Take 10 mg by mouth daily.    Marland Kitchen. FLUoxetine (PROZAC) 20 MG capsule 1 cap po tid 270 capsule 3  . lamoTRIgine (LAMICTAL) 100 MG tablet TAKE 1 TABLET BY MOUTH TWO  TIMES DAILY 180 tablet 3  . Naproxen Sodium (ALEVE) 220 MG CAPS Take 1 capsule by mouth as needed.    . traZODone (DESYREL) 50 MG tablet TAKE 1 TO 2 TABLETS AT  BEDTIME AS NEEDED FOR SLEEP 180 tablet 3  . zolpidem (AMBIEN) 10 MG tablet TAKE 1/2-1 TABLET BY MOUTH AT BEDTIME 30 tablet 5  . benzonatate (TESSALON) 200 MG capsule Take 1 capsule (200 mg total) by mouth 3 (three) times daily as needed for cough. (Patient not taking: Reported on 09/29/2017) 20 capsule 1  . predniSONE (DELTASONE) 20 MG tablet 2 tabs po qd x 5d, then 1 tab po qd x 5d (Patient not taking: Reported on 09/29/2017) 15 tablet 0  . traZODone (DESYREL) 50 MG tablet TAKE  1 TO 2 TABLETS AT  BEDTIME AS NEEDED FOR SLEEP (Patient not taking: Reported on 09/29/2017) 180 tablet 3   Facility-Administered Medications Prior to Visit  Medication Dose Route Frequency Provider Last Rate Last Dose  . TDaP (BOOSTRIX) injection 0.5 mL  0.5 mL Intramuscular Once Izrael Peak, Maryjean Morn, MD        Allergies  Allergen Reactions  . Clonazepam Other (See Comments)    manic  . Effexor [Venlafaxine Hcl] Other (See Comments)    "bad side effects"  . Risperdal [Risperidone] Other (See Comments)    "ineffective + "side effects"  . Stelazine [Trifluoperazine] Other (See Comments)    "terrible, awful side effects"  . Zyprexa [Olanzapine] Other (See Comments)    "terrible, awful side effects"    ROS As per HPI  PE: Blood pressure  112/69, pulse 71, temperature 98.2 F (36.8 C), temperature source Oral, resp. rate 16, height 5' 11.5" (1.816 m), weight 248 lb 6 oz (112.7 kg), SpO2 95 %. Body mass index is 34.16 kg/m.  Wt Readings from Last 2 Encounters:  09/29/17 248 lb 6 oz (112.7 kg)  06/03/17 251 lb (113.9 kg)    Gen: alert, oriented x 4, affect pleasant.  Lucid thinking and conversation noted. HEENT: PERRLA, EOMI.   Neck: no LAD, mass, or thyromegaly. CV: RRR, no m/r/g LUNGS: CTA bilat, nonlabored. NEURO: no tremor or tics noted on observation.  Coordination intact. CN 2-12 grossly intact bilaterally, strength 5/5 in all extremeties.  No ataxia.   LABS:  Lab Results  Component Value Date   TSH 2.54 03/31/2017   Lab Results  Component Value Date   WBC 8.1 03/31/2017   HGB 15.2 03/31/2017   HCT 46.5 03/31/2017   MCV 86.1 03/31/2017   PLT 251.0 03/31/2017   Lab Results  Component Value Date   CREATININE 1.17 03/31/2017   BUN 19 03/31/2017   NA 140 03/31/2017   K 4.9 03/31/2017   CL 103 03/31/2017   CO2 30 03/31/2017   Lab Results  Component Value Date   ALT 25 03/31/2017   AST 20 03/31/2017   ALKPHOS 60 03/31/2017   BILITOT 0.4 03/31/2017   Lab Results  Component Value Date   CHOL 185 03/31/2017   Lab Results  Component Value Date   HDL 33.70 (L) 03/31/2017   Lab Results  Component Value Date   LDLCALC 82 04/06/2012   Lab Results  Component Value Date   TRIG 277.0 (H) 03/31/2017   Lab Results  Component Value Date   CHOLHDL 5 03/31/2017    IMPRESSION AND PLAN:  1) Bipolar d/o II: mood stable long term now on lamictal and fluoxetine + consistent counseling. No changes today.  2) Insomnia: was doing better on combo of trazodone and ambien.  Has been out of ambien for a while and we'll get him back on this.  He has not had any adverse side effects on this med.  Ambien eRx'd today.  3) GAD: this has responded well to current med regimen + counseling. No changes  today.  No labs needed at this time.  An After Visit Summary was printed and given to the patient.  FOLLOW UP: Return in about 6 months (around 04/01/2018) for annual CPE (fasting).  Signed:  Santiago Bumpers, MD           09/29/2017

## 2018-01-16 ENCOUNTER — Other Ambulatory Visit: Payer: Self-pay | Admitting: Family Medicine

## 2018-04-02 ENCOUNTER — Other Ambulatory Visit: Payer: Self-pay | Admitting: Family Medicine

## 2018-04-03 NOTE — Telephone Encounter (Signed)
RF request for  Ambien LOV: 09/29/17 Next ov: 04/05/17 Last written: 09/29/17 #90 x1 refill   Please Advise

## 2018-04-05 ENCOUNTER — Encounter: Payer: 59 | Admitting: Family Medicine

## 2018-04-22 ENCOUNTER — Other Ambulatory Visit: Payer: Self-pay | Admitting: Family Medicine

## 2018-05-08 ENCOUNTER — Other Ambulatory Visit: Payer: Self-pay | Admitting: Family Medicine

## 2018-05-09 NOTE — Telephone Encounter (Signed)
RF request for lamictal LOV: 09/29/17 Next ov: None Last written: 06/16/17 #180 w/ 3RF  RF request for trazodone Last written: 06/16/17 #180 w/ 3RF  Please advise. Thanks.

## 2018-07-06 ENCOUNTER — Other Ambulatory Visit: Payer: Self-pay | Admitting: Family Medicine

## 2018-07-22 ENCOUNTER — Other Ambulatory Visit: Payer: Self-pay | Admitting: Family Medicine

## 2018-07-24 NOTE — Telephone Encounter (Signed)
LMOM for pt to CB to schedule.  Over due for appt.

## 2018-10-25 ENCOUNTER — Other Ambulatory Visit: Payer: Self-pay

## 2018-10-25 ENCOUNTER — Encounter: Payer: Self-pay | Admitting: Family Medicine

## 2018-10-25 ENCOUNTER — Ambulatory Visit (INDEPENDENT_AMBULATORY_CARE_PROVIDER_SITE_OTHER): Payer: 59 | Admitting: Family Medicine

## 2018-10-25 VITALS — Temp 97.9°F | Wt 232.0 lb

## 2018-10-25 DIAGNOSIS — F411 Generalized anxiety disorder: Secondary | ICD-10-CM

## 2018-10-25 DIAGNOSIS — F5101 Primary insomnia: Secondary | ICD-10-CM

## 2018-10-25 DIAGNOSIS — F3181 Bipolar II disorder: Secondary | ICD-10-CM | POA: Diagnosis not present

## 2018-10-25 MED ORDER — LAMOTRIGINE 100 MG PO TABS: 100.0000 mg | ORAL_TABLET | Freq: Two times a day (BID) | ORAL | 3 refills | Status: DC

## 2018-10-25 MED ORDER — ZOLPIDEM TARTRATE 10 MG PO TABS: ORAL_TABLET | ORAL | 1 refills | Status: DC

## 2018-10-25 MED ORDER — FLUOXETINE HCL 20 MG PO CAPS: ORAL_CAPSULE | ORAL | 3 refills | Status: DC

## 2018-10-25 MED ORDER — TRAZODONE HCL 50 MG PO TABS: 50.0000 mg | ORAL_TABLET | Freq: Every evening | ORAL | 3 refills | Status: DC | PRN

## 2018-10-25 NOTE — Progress Notes (Signed)
Virtual Visit via Video Note  I connected with pt on 10/25/18 at  4:00 PM EDT by a video enabled telemedicine application and verified that I am speaking with the correct person using two identifiers.  Location patient: home Location provider:work or home office Persons participating in the virtual visit: patient, provider  I discussed the limitations of evaluation and management by telemedicine and the availability of in person appointments. The patient expressed understanding and agreed to proceed.  Telemedicine visit is a necessity given the COVID-19 restrictions in place at the current time.  HPI: 48 y/o WM being seen today for f/u bipolar II disorder, GAD, and insomnia. I last saw him for f/u 09/29/17. At that time all was stable, no med changes were made and he was continuing to get benefit from long term counseling.  Mood/anxiety: very stable.  Compliant with meds, still seeing psychologist, Dr. Ledon SnareMcKnight. He is enjoying the time working exclusively from home, says it fits his personality well.  Sleep: he sleeps well as long as he takes trazodone and ambien regularly (but not every night). No adverse effects from these meds. PMP AWARE reviewed today: most recent fill of ambien was 09/08/18, #30, rx'd by me. No suspicious activity.   ROS: See pertinent positives and negatives per HPI.  Past Medical History:  Diagnosis Date  . Anxiety   . Bipolar II disorder (HCC)   . Chronic headaches   . Chronic low back pain    with chronic radiculopathy pain.  Has seen neurosurgeon, got injections which did not help.  Pt does not want to get surgery.  . Chronic sinusitis   . GERD (gastroesophageal reflux disease)    with schatski's ring  . Hay fever   . History of prostatitis    BPH with LUTS--failed proscar and flomax.  Pt reports cyst normal.  . Hypertriglyceridemia    +mildly low HDL.  No meds.  . Insomnia   . Obesity, Class I, BMI 30-34.9   . OSA (obstructive sleep apnea) 07/2008    Hx of intolerance to CPAP sedondary to anxiety/max intolerance    Past Surgical History:  Procedure Laterality Date  . lumbar back surg  2010    Family History  Problem Relation Age of Onset  . CVA Father   . Arthritis-Osteo Mother     SOCIAL HX:  Social History   Socioeconomic History  . Marital status: Married    Spouse name: Not on file  . Number of children: Not on file  . Years of education: Not on file  . Highest education level: Not on file  Occupational History  . Occupation: Acupuncturistelectrical engineer  Social Needs  . Financial resource strain: Not on file  . Food insecurity    Worry: Not on file    Inability: Not on file  . Transportation needs    Medical: Not on file    Non-medical: Not on file  Tobacco Use  . Smoking status: Never Smoker  . Smokeless tobacco: Never Used  Substance and Sexual Activity  . Alcohol use: No  . Drug use: No  . Sexual activity: Not on file  Lifestyle  . Physical activity    Days per week: Not on file    Minutes per session: Not on file  . Stress: Not on file  Relationships  . Social Musicianconnections    Talks on phone: Not on file    Gets together: Not on file    Attends religious service: Not on file  Active member of club or organization: Not on file    Attends meetings of clubs or organizations: Not on file    Relationship status: Not on file  Other Topics Concern  . Not on file  Social History Narrative   Divorced, remarried.  One teenage stepdaughter.   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   College educated.   Normal american diet.  No exercise.   No T/A/Ds.      Current Outpatient Medications:  .  albuterol (PROAIR HFA) 108 (90 BASE) MCG/ACT inhaler, Inhale 2 puffs into the lungs every 6 (six) hours as needed for wheezing or shortness of breath., Disp: 1 Inhaler, Rfl: 1 .  aspirin-acetaminophen-caffeine (EXCEDRIN MIGRAINE) 250-250-65 MG per tablet, Take 1 tablet by mouth  every 6 (six) hours as needed., Disp: , Rfl:  .  Azelastine-Fluticasone (DYMISTA) 137-50 MCG/ACT SUSP, Place 2 sprays into the nose 2 (two) times daily., Disp: 3 Bottle, Rfl: 3 .  cetirizine (ZYRTEC) 10 MG tablet, Take 10 mg by mouth daily., Disp: , Rfl:  .  FLUoxetine (PROZAC) 20 MG capsule, TAKE 1 CAPSULE BY MOUTH 3  TIMES DAILY, Disp: 270 capsule, Rfl: 1 .  lamoTRIgine (LAMICTAL) 100 MG tablet, TAKE 1 TABLET BY MOUTH TWO  TIMES DAILY, Disp: 180 tablet, Rfl: 3 .  Naproxen Sodium (ALEVE) 220 MG CAPS, Take 1 capsule by mouth as needed., Disp: , Rfl:  .  traZODone (DESYREL) 50 MG tablet, TAKE 1 TO 2 TABLETS BY  MOUTH AT BEDTIME AS NEEDED  FOR SLEEP, Disp: 180 tablet, Rfl: 3 .  zolpidem (AMBIEN) 10 MG tablet, TAKE 1/2-1 TABLET BY MOUTH AT BEDTIME, Disp: 90 tablet, Rfl: 1 No current facility-administered medications for this visit.   Facility-Administered Medications Ordered in Other Visits:  .  TDaP (BOOSTRIX) injection 0.5 mL, 0.5 mL, Intramuscular, Once, Yanina Knupp, Maryjean MornPhilip H, MD  EXAM:  VITALS per patient if applicable: Temp 97.9 F (36.6 C) (Oral)   Wt 232 lb (105.2 kg)   BMI 31.91 kg/m    GENERAL: alert, oriented, appears well and in no acute distress  HEENT: atraumatic, conjunttiva clear, no obvious abnormalities on inspection of external nose and ears  NECK: normal movements of the head and neck  LUNGS: on inspection no signs of respiratory distress, breathing rate appears normal, no obvious gross SOB, gasping or wheezing  CV: no obvious cyanosis  MS: moves all visible extremities without noticeable abnormality  PSYCH/NEURO: pleasant and cooperative, no obvious depression or anxiety, speech and thought processing grossly intact  LABS: none today  Lab Results  Component Value Date   TSH 2.54 03/31/2017   Lab Results  Component Value Date   WBC 8.1 03/31/2017   HGB 15.2 03/31/2017   HCT 46.5 03/31/2017   MCV 86.1 03/31/2017   PLT 251.0 03/31/2017   Lab Results   Component Value Date   CREATININE 1.17 03/31/2017   BUN 19 03/31/2017   NA 140 03/31/2017   K 4.9 03/31/2017   CL 103 03/31/2017   CO2 30 03/31/2017   Lab Results  Component Value Date   ALT 25 03/31/2017   AST 20 03/31/2017   ALKPHOS 60 03/31/2017   BILITOT 0.4 03/31/2017   Lab Results  Component Value Date   CHOL 185 03/31/2017   Lab Results  Component Value Date   HDL 33.70 (L) 03/31/2017   Lab Results  Component Value Date   LDLCALC 82 04/06/2012   Lab Results  Component Value Date   TRIG  277.0 (H) 03/31/2017   Lab Results  Component Value Date   CHOLHDL 5 03/31/2017    ASSESSMENT AND PLAN:  Discussed the following assessment and plan:  1) Bipolar d/o, GAD, and insomnia: all very stable.  Pt is doing great. No changes today. I sent in rx's. Will get pt to sign CSC and do UDS when he comes into office at the time of next f/u visit. Notified him of necessity of seeing him q12mo while I am rx'ing ambien for him and he expressed understanding.    I discussed the assessment and treatment plan with the patient. The patient was provided an opportunity to ask questions and all were answered. The patient agreed with the plan and demonstrated an understanding of the instructions.   The patient was advised to call back or seek an in-person evaluation if the symptoms worsen or if the condition fails to improve as anticipated.  F/u: 6 mo RCI  Signed:  Crissie Sickles, MD           10/25/2018

## 2019-08-02 ENCOUNTER — Other Ambulatory Visit: Payer: Self-pay | Admitting: *Deleted

## 2019-08-02 NOTE — Patient Outreach (Signed)
[facility]) Care Management  08/02/2019  Alex Hall Dec 30, 1970 [id number]   Referral Date: 5/12 Referral Source: Insurance Referral Reason: high cost insurance member Insurance: [facility]   Outreach attempt #1, unsuccessful, HIPAA compliant voice message left.  Plan: RN CM will send unsuccessful outreach letter and follow up within the next 3-4 business days.  Kemper Durie, [name], MSN [facility] Care Management  [facility] Manager [phone]

## 2019-08-07 ENCOUNTER — Other Ambulatory Visit: Payer: Self-pay | Admitting: *Deleted

## 2019-08-07 NOTE — Patient Outreach (Signed)
[facility]) Care Management  08/07/2019  Alex Hall 17-Sep-1970 [id number]   Referral Date: 5/12 Referral Source: Insurance Referral Reason: high cost insurance member Insurance: [facility]   Outreach attempt #2, unsuccessful, HIPAA compliant voice message left.  Plan: RN CM will follow up within the next 3-4 business days.  Kemper Durie, [name], MSN [facility] Care Management  [facility] Manager [phone]

## 2019-08-10 ENCOUNTER — Other Ambulatory Visit: Payer: Self-pay | Admitting: *Deleted

## 2019-08-10 NOTE — Patient Outreach (Signed)
[facility]) Care Management  08/10/2019  RAKEEM COLLEY [birth year] [id number]   Referral Date:5/12 Referral Source:Insurance Referral Reason:high cost insurance member Insurance:[employer]   Outreach attempt #3, unsuccessful, HIPAA compliant voice message left.  Plan: RN CM willfollow up within the next 4 weeks.  Kemper Durie, [name], MSN [facility] Care Management  [facility] Manager [phone]

## 2019-09-06 ENCOUNTER — Other Ambulatory Visit: Payer: Self-pay | Admitting: *Deleted

## 2019-09-06 NOTE — Patient Outreach (Signed)
[facility]) Care Management  09/06/2019  Alex Hall 12/07/1970 [id number]   Referral Date:5/12 Referral Source:Insurance Referral Reason:high cost insurance member Insurance:[employer]   Outreach attempt #4, successful.  Member report this is not a good time to talk, request call back when available.  If no call back by end of day, will close case due to inability to engage member.  Kemper Durie, [name], MSN [facility] Care Management  [facility] Manager [phone]

## 2019-09-09 ENCOUNTER — Other Ambulatory Visit: Payer: Self-pay | Admitting: Family Medicine

## 2019-09-26 ENCOUNTER — Other Ambulatory Visit: Payer: Self-pay | Admitting: Family Medicine

## 2019-09-26 ENCOUNTER — Encounter: Payer: Self-pay | Admitting: Family Medicine

## 2019-09-26 ENCOUNTER — Other Ambulatory Visit: Payer: Self-pay

## 2019-09-26 ENCOUNTER — Ambulatory Visit (INDEPENDENT_AMBULATORY_CARE_PROVIDER_SITE_OTHER): Payer: 59 | Admitting: Family Medicine

## 2019-09-26 VITALS — BP 92/61 | HR 76 | Temp 98.7°F | Resp 16 | Ht 71.5 in | Wt 205.2 lb

## 2019-09-26 DIAGNOSIS — F409 Phobic anxiety disorder, unspecified: Secondary | ICD-10-CM

## 2019-09-26 DIAGNOSIS — N411 Chronic prostatitis: Secondary | ICD-10-CM | POA: Diagnosis not present

## 2019-09-26 DIAGNOSIS — Z131 Encounter for screening for diabetes mellitus: Secondary | ICD-10-CM | POA: Diagnosis not present

## 2019-09-26 DIAGNOSIS — R3912 Poor urinary stream: Secondary | ICD-10-CM

## 2019-09-26 DIAGNOSIS — E781 Pure hyperglyceridemia: Secondary | ICD-10-CM | POA: Diagnosis not present

## 2019-09-26 DIAGNOSIS — M792 Neuralgia and neuritis, unspecified: Secondary | ICD-10-CM

## 2019-09-26 DIAGNOSIS — E663 Overweight: Secondary | ICD-10-CM | POA: Diagnosis not present

## 2019-09-26 DIAGNOSIS — F5105 Insomnia due to other mental disorder: Secondary | ICD-10-CM

## 2019-09-26 LAB — CBC WITH DIFFERENTIAL/PLATELET
Basophils Absolute: 0 10*3/uL (ref 0.0–0.1)
Basophils Relative: 0.5 % (ref 0.0–3.0)
Eosinophils Absolute: 0.1 10*3/uL (ref 0.0–0.7)
Eosinophils Relative: 1.9 % (ref 0.0–5.0)
HCT: 44.9 % (ref 39.0–52.0)
Hemoglobin: 15 g/dL (ref 13.0–17.0)
Lymphocytes Relative: 29.9 % (ref 12.0–46.0)
Lymphs Abs: 2 10*3/uL (ref 0.7–4.0)
MCHC: 33.4 g/dL (ref 30.0–36.0)
MCV: 86.7 fl (ref 78.0–100.0)
Monocytes Absolute: 0.4 10*3/uL (ref 0.1–1.0)
Monocytes Relative: 5.4 % (ref 3.0–12.0)
Neutro Abs: 4.2 10*3/uL (ref 1.4–7.7)
Neutrophils Relative %: 62.3 % (ref 43.0–77.0)
Platelets: 252 10*3/uL (ref 150.0–400.0)
RBC: 5.18 Mil/uL (ref 4.22–5.81)
RDW: 14.3 % (ref 11.5–15.5)
WBC: 6.8 10*3/uL (ref 4.0–10.5)

## 2019-09-26 LAB — COMPREHENSIVE METABOLIC PANEL
ALT: 21 U/L (ref 0–53)
AST: 17 U/L (ref 0–37)
Albumin: 4.7 g/dL (ref 3.5–5.2)
Alkaline Phosphatase: 68 U/L (ref 39–117)
BUN: 19 mg/dL (ref 6–23)
CO2: 30 mEq/L (ref 19–32)
Calcium: 10.1 mg/dL (ref 8.4–10.5)
Chloride: 102 mEq/L (ref 96–112)
Creatinine, Ser: 1.23 mg/dL (ref 0.40–1.50)
GFR: 62.74 mL/min (ref >60.00)
Glucose, Bld: 84 mg/dL (ref 70–99)
Potassium: 5.1 mEq/L (ref 3.5–5.1)
Sodium: 139 mEq/L (ref 135–145)
Total Bilirubin: 0.4 mg/dL (ref 0.2–1.2)
Total Protein: 7.6 g/dL (ref 6.0–8.3)

## 2019-09-26 LAB — LIPID PANEL
Cholesterol: 147 mg/dL (ref 0–200)
HDL: 36.9 mg/dL — ABNORMAL LOW (ref >39.00)
NonHDL: 109.9
Total CHOL/HDL Ratio: 4
Triglycerides: 210 mg/dL — ABNORMAL HIGH (ref 0.0–149.0)
VLDL: 42 mg/dL — ABNORMAL HIGH (ref 0.0–40.0)

## 2019-09-26 LAB — PSA: PSA: 1.03 ng/mL (ref 0.10–4.00)

## 2019-09-26 LAB — LDL CHOLESTEROL, DIRECT: Direct LDL: 73 mg/dL

## 2019-09-26 MED ORDER — GABAPENTIN 100 MG PO CAPS: ORAL_CAPSULE | ORAL | 0 refills | Status: DC

## 2019-09-26 MED ORDER — LEVOFLOXACIN 500 MG PO TABS
500.0000 mg | ORAL_TABLET | Freq: Every day | ORAL | 0 refills | Status: DC
Start: 2019-09-26 — End: 2019-10-17

## 2019-09-26 MED ORDER — LEVOFLOXACIN 500 MG PO TABS: 500.0000 mg | ORAL_TABLET | Freq: Every day | ORAL | 0 refills | Status: DC

## 2019-09-26 MED ORDER — ZOLPIDEM TARTRATE 10 MG PO TABS: ORAL_TABLET | ORAL | 1 refills | Status: DC

## 2019-09-26 NOTE — Progress Notes (Signed)
OFFICE VISIT  09/26/2019   CC:  Chief Complaint  Patient presents with  . Follow-up    RCI, pt is fasting   HPI:    Patient is a 49 y.o. Caucasian male who presents for f/u bipolar II disorder, GAD, and insomnia. I last saw him for f/u 10/25/2018. At that time all was stable, no med changes were made and he was continuing to get benefit from long term counseling.  INTERIM HX: Wife left him, she apparently has delusions. Separated 8 mo, will get divorced. Job is the same. He is actually coping with things pretty well, did have signif impaired appetite for a while.  Also started eating healthier, exercise is hiking some and playing golf. Still taking trazodone and ambien.  Nerve pain in legs x 11 yrs since having surgery. I rx'd him gabapentin at some point in the past but he did not take b/c of fear of the med.  He is now reconsidering this med. Hx of back injections-no help.  No surgery.  Hx of difficulty urinating: hard to stop, poor stream x years. Stream starts and stops.  No urgency or frequency.  No dysuria or other pain associated.  We tried flomax and no help from this. Says it started after getting dx'd with acute prostatitis and has waxed and waned over the years. Saw urologist and was told cystoscopy, etc, all was ok, was told no tx needed.    Has hx of being overweight, asks if he can be screened for diabetes, also asks for cholesterol panel b/c of history of hypertriglyceridemia.  He has benefited from use of ambien on a long term basis for insomnia.  PMP AWARE reviewed today: Nothing is listed.  ROS: no fevers, no CP, no SOB, no wheezing, no cough, no dizziness, no HAs, no rashes, no melena/hematochezia.  No polyuria or polydipsia.  No myalgias or arthralgias.  No focal weakness, paresthesias, or tremors.  No acute vision or hearing abnormalities. No n/v/d or abd pain.  No palpitations.    Past Medical History:  Diagnosis Date  . Anxiety   . Bipolar II disorder  (HCC)   . Chronic headaches   . Chronic low back pain    with chronic radiculopathy pain.  Has seen neurosurgeon, got injections which did not help.  Pt does not want to get surgery.  . Chronic sinusitis   . GERD (gastroesophageal reflux disease)    with schatski's ring  . Hay fever   . History of prostatitis    BPH with LUTS--failed proscar and flomax.  Pt reports cyst normal.  . Hypertriglyceridemia    +mildly low HDL.  No meds.  . Insomnia   . Obesity, Class I, BMI 30-34.9   . OSA (obstructive sleep apnea) 07/2008   Hx of intolerance to CPAP sedondary to anxiety/max intolerance    Past Surgical History:  Procedure Laterality Date  . lumbar back surg  2010    Outpatient Medications Prior to Visit  Medication Sig Dispense Refill  . aspirin-acetaminophen-caffeine (EXCEDRIN MIGRAINE) 250-250-65 MG per tablet Take 1 tablet by mouth every 6 (six) hours as needed.    . cetirizine (ZYRTEC) 10 MG tablet Take 10 mg by mouth daily.    . Naproxen Sodium (ALEVE) 220 MG CAPS Take 1 capsule by mouth as needed.    . traZODone (DESYREL) 50 MG tablet Take 1-2 tablets (50-100 mg total) by mouth at bedtime as needed. for sleep 180 tablet 3  . FLUoxetine (PROZAC) 20 MG  capsule TAKE 1 CAPSULE BY MOUTH 3  TIMES DAILY 270 capsule 3  . lamoTRIgine (LAMICTAL) 100 MG tablet Take 1 tablet (100 mg total) by mouth 2 (two) times daily. 180 tablet 3  . zolpidem (AMBIEN) 10 MG tablet 1 tab po qhs 90 tablet 1  . albuterol (PROAIR HFA) 108 (90 BASE) MCG/ACT inhaler Inhale 2 puffs into the lungs every 6 (six) hours as needed for wheezing or shortness of breath. (Patient not taking: Reported on 09/26/2019) 1 Inhaler 1  . Azelastine-Fluticasone (DYMISTA) 137-50 MCG/ACT SUSP Place 2 sprays into the nose 2 (two) times daily. (Patient not taking: Reported on 09/26/2019) 3 Bottle 3   Facility-Administered Medications Prior to Visit  Medication Dose Route Frequency Provider Last Rate Last Admin  . TDaP (BOOSTRIX)  injection 0.5 mL  0.5 mL Intramuscular Once Xuan Mateus, Maryjean Morn, MD        Allergies  Allergen Reactions  . Clonazepam Other (See Comments)    manic  . Effexor [Venlafaxine Hcl] Other (See Comments)    "bad side effects"  . Risperdal [Risperidone] Other (See Comments)    "ineffective + "side effects"  . Stelazine [Trifluoperazine] Other (See Comments)    "terrible, awful side effects"  . Zyprexa [Olanzapine] Other (See Comments)    "terrible, awful side effects"    ROS As per HPI  PE: Vitals with BMI 09/26/2019 10/25/2018 09/29/2017  Height 5' 11.5" - 5' 11.5"  Weight 205 lbs 3 oz 232 lbs 248 lbs 6 oz  BMI 28.22 - 34.16  Systolic 92 - 112  Diastolic 61 - 69  Pulse 76 - 71  O2 sat on RA today is 97%  Gen: Alert, well appearing.  Patient is oriented to person, place, time, and situation. AFFECT: pleasant, lucid thought and speech. CV: RRR, no m/r/g.   LUNGS: CTA bilat, nonlabored resps, good aeration in all lung fields. Rectal exam: negative without mass, lesions or tenderness, PROSTATE EXAM: smooth and symmetric without nodules or tenderness.  Strong urge to urinate with palp of prostate   LABS:  Lab Results  Component Value Date   TSH 2.54 03/31/2017   Lab Results  Component Value Date   WBC 6.8 09/26/2019   HGB 15.0 09/26/2019   HCT 44.9 09/26/2019   MCV 86.7 09/26/2019   PLT 252.0 09/26/2019   Lab Results  Component Value Date   CREATININE 1.23 09/26/2019   BUN 19 09/26/2019   NA 139 09/26/2019   K 5.1 09/26/2019   CL 102 09/26/2019   CO2 30 09/26/2019   Lab Results  Component Value Date   ALT 21 09/26/2019   AST 17 09/26/2019   ALKPHOS 68 09/26/2019   BILITOT 0.4 09/26/2019   Lab Results  Component Value Date   CHOL 147 09/26/2019   Lab Results  Component Value Date   HDL 36.90 (L) 09/26/2019   Lab Results  Component Value Date   LDLCALC 82 04/06/2012   Lab Results  Component Value Date   TRIG 210.0 (H) 09/26/2019   Lab Results   Component Value Date   CHOLHDL 4 09/26/2019    IMPRESSION AND PLAN:  1) 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  He lacks any other urinary symptoms.  Per pt report, a past urology eval was unrevealing (cystoscopy was done).   Will do trial of levaquin 500mg  qd x 14d.  Urine obtained today: culture tube filled but pt unable to give enough specimen to do a dipstick UA on today.  May need recheck by urology to r/o urethral stricture.  2) Chronic neuropathic pain, both legs (past hx of lumbar radiculopathy). Trial of gabapentin started today: 100mg  qhs and slowly titrate up to 200mg  tid over the next 2-3 weeks.  Therapeutic expectations and side effect profile of medication discussed today.  Patient's questions answered. We discussed how this MIGHT help his urinary issues IF they are neuropathic in etiology.  3) Insomnia Stable. RF'd ambien 10 mg today. Continue trazodone 50-100mg  qhs.  4) Bipolar d/o and GAD: stable despite a rough patch lately due to separation/impending divorce. Continue lamictal 100 mg bid and fluoxetine 60 mg qd. Continue long term counseling.  5) Screening for DM, f/u hypertrig->CBC, CMET, FLP.  An After Visit Summary was printed and given to the patient.  FOLLOW UP: Return for 2-3 wks f/u urinary and neuropathic pain issues.  Signed:  , MD           09/26/2019

## 2019-09-27 LAB — URINE CULTURE
MICRO NUMBER:: 10704406
SPECIMEN QUALITY:: ADEQUATE

## 2019-10-17 ENCOUNTER — Encounter: Payer: Self-pay | Admitting: Family Medicine

## 2019-10-17 ENCOUNTER — Other Ambulatory Visit: Payer: Self-pay

## 2019-10-17 ENCOUNTER — Ambulatory Visit (INDEPENDENT_AMBULATORY_CARE_PROVIDER_SITE_OTHER): Payer: 59 | Admitting: Family Medicine

## 2019-10-17 VITALS — BP 107/73 | HR 59 | Temp 97.4°F | Resp 16 | Ht 71.5 in | Wt 209.4 lb

## 2019-10-17 DIAGNOSIS — N401 Enlarged prostate with lower urinary tract symptoms: Secondary | ICD-10-CM

## 2019-10-17 DIAGNOSIS — M792 Neuralgia and neuritis, unspecified: Secondary | ICD-10-CM

## 2019-10-17 DIAGNOSIS — N138 Other obstructive and reflux uropathy: Secondary | ICD-10-CM

## 2019-10-17 MED ORDER — TAMSULOSIN HCL 0.4 MG PO CAPS: 0.4000 mg | ORAL_CAPSULE | Freq: Every day | ORAL | 0 refills | Status: DC

## 2019-10-17 MED ORDER — GABAPENTIN 300 MG PO CAPS
300.0000 mg | ORAL_CAPSULE | Freq: Three times a day (TID) | ORAL | 0 refills | Status: DC
Start: 2019-10-17 — End: 2019-11-07

## 2019-10-17 NOTE — Progress Notes (Signed)
OFFICE VISIT  10/17/2019   CC:  Chief Complaint  Patient presents with  . Follow-up    urinary/neuropathic issues    HPI:    Patient is a 49 y.o. Caucasian male who presents for 3 wk f/u poor urinary stream and recent start of gabapentin for neuropathic pain.  A/P as of last visit: "1) 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  He lacks any other urinary symptoms.  Per pt report, a past urology eval was unrevealing (cystoscopy was done).   Will do trial of levaquin 500mg  qd x 14d.  Urine obtained today: culture tube filled but pt unable to give enough specimen to do a dipstick UA on today. May need recheck by urology to r/o urethral stricture.  2) Chronic neuropathic pain, both legs (past hx of lumbar radiculopathy). Trial of gabapentin started today: 100mg  qhs and slowly titrate up to 200mg  tid over the next 2-3 weeks.  Therapeutic expectations and side effect profile of medication discussed today.  Patient's questions answered. We discussed how this MIGHT help his urinary issues IF they are neuropathic in etiology.  3) Insomnia Stable. RF'd ambien 10 mg today. Continue trazodone 50-100mg  qhs.  4) Bipolar d/o and GAD: stable despite a rough patch lately due to separation/impending divorce. Continue lamictal 100 mg bid and fluoxetine 60 mg qd. Continue long term counseling.  5) Screening for DM, f/u hypertrig->CBC, CMET, FLP."  INTERIM HX: Urine clx last visit showed no growth/mixed flora. Labs fine except mildly elev trigs/low HDL. Work stress up, eating is up. Nerve pain in both legs is slightly better.  Made it to 200 tid and no side effects.  Some delay in getting on abx, just finished this up yesterday.  He can't tell any difference. Stream starts and stops and is not strong, some delay in complete emptying.  No pain, no blood. No urinary frequency, nocturia, or urgency.   Past Medical History:  Diagnosis Date  .  Anxiety   . Bipolar II disorder (HCC)   . Chronic headaches   . Chronic low back pain    with chronic radiculopathy pain.  Has seen neurosurgeon, got injections which did not help.  Pt does not want to get surgery.  . Chronic sinusitis   . GERD (gastroesophageal reflux disease)    with schatski's ring  . Hay fever   . History of prostatitis    BPH with LUTS--failed proscar and flomax.  Pt reports cyst normal.  . Hypertriglyceridemia    +mildly low HDL.  No meds.  . Insomnia   . Obesity, Class I, BMI 30-34.9   . OSA (obstructive sleep apnea) 07/2008   Hx of intolerance to CPAP sedondary to anxiety/max intolerance    Past Surgical History:  Procedure Laterality Date  . lumbar back surg  2010    Outpatient Medications Prior to Visit  Medication Sig Dispense Refill  . aspirin-acetaminophen-caffeine (EXCEDRIN MIGRAINE) 250-250-65 MG per tablet Take 1 tablet by mouth every 6 (six) hours as needed.    . cetirizine (ZYRTEC) 10 MG tablet Take 10 mg by mouth daily.    FLUoxetine (PROZAC) 20 MG capsule TAKE 1 CAPSULE BY MOUTH 3  TIMES DAILY 270 capsule 1  . lamoTRIgine (LAMICTAL) 100 MG tablet TAKE 1 TABLET BY MOUTH  TWICE DAILY 180 tablet 1  . Naproxen Sodium (ALEVE) 220 MG CAPS Take 1 capsule by mouth as needed.    . traZODone (DESYREL) 50 MG tablet Take 1-2 tablets (50-100 mg total)  by mouth at bedtime as needed. for sleep 180 tablet 3  . zolpidem (AMBIEN) 10 MG tablet 1 tab po qhs 90 tablet 1  . gabapentin (NEURONTIN) 100 MG capsule 2 tabs po tid 180 capsule 0  . albuterol (PROAIR HFA) 108 (90 BASE) MCG/ACT inhaler Inhale 2 puffs into the lungs every 6 (six) hours as needed for wheezing or shortness of breath. (Patient not taking: Reported on 09/26/2019) 1 Inhaler 1  . Azelastine-Fluticasone (DYMISTA) 137-50 MCG/ACT SUSP Place 2 sprays into the nose 2 (two) times daily. (Patient not taking: Reported on 09/26/2019) 3 Bottle 3  . levofloxacin (LEVAQUIN) 500 MG tablet Take 1 tablet (500 mg  total) by mouth daily. (Patient not taking: Reported on 10/17/2019) 14 tablet 0   Facility-Administered Medications Prior to Visit  Medication Dose Route Frequency Provider Last Rate Last Admin  . TDaP (BOOSTRIX) injection 0.5 mL  0.5 mL Intramuscular Once Srijan Givan, Maryjean Morn, MD        Allergies  Allergen Reactions  . Clonazepam Other (See Comments)    manic  . Effexor [Venlafaxine Hcl] Other (See Comments)    "bad side effects"  . Risperdal [Risperidone] Other (See Comments)    "ineffective + "side effects"  . Stelazine [Trifluoperazine] Other (See Comments)    "terrible, awful side effects"  . Zyprexa [Olanzapine] Other (See Comments)    "terrible, awful side effects"    ROS As per HPI  PE: Blood pressure 107/73, pulse (!) 59, temperature (!) 97.4 F (36.3 C), temperature source Oral, resp. rate 16, height 5' 11.5" (1.816 m), weight 209 lb 6.4 oz (95 kg), SpO2 97 %. Gen: Alert, well appearing.  Patient is oriented to person, place, time, and situation. AFFECT: pleasant, lucid thought and speech. No further exam today.  LABS:  Lab Results  Component Value Date   TSH 2.54 03/31/2017   Lab Results  Component Value Date   WBC 6.8 09/26/2019   HGB 15.0 09/26/2019   HCT 44.9 09/26/2019   MCV 86.7 09/26/2019   PLT 252.0 09/26/2019   Lab Results  Component Value Date   CREATININE 1.23 09/26/2019   BUN 19 09/26/2019   NA 139 09/26/2019   K 5.1 09/26/2019   CL 102 09/26/2019   CO2 30 09/26/2019   Lab Results  Component Value Date   ALT 21 09/26/2019   AST 17 09/26/2019   ALKPHOS 68 09/26/2019   BILITOT 0.4 09/26/2019   Lab Results  Component Value Date   CHOL 147 09/26/2019   Lab Results  Component Value Date   HDL 36.90 (L) 09/26/2019   Lab Results  Component Value Date   LDLCALC 82 04/06/2012   Lab Results  Component Value Date   TRIG 210.0 (H) 09/26/2019   Lab Results  Component Value Date   CHOLHDL 4 09/26/2019   Lab Results  Component Value  Date   PSA 1.03 09/26/2019    IMPRESSION AND PLAN:  1) Chronic LBP with bilat legs neuropathic pain: slightly improved on gabapentin initiation, tolerating 200 tid.  Increase to 300 mg tid and continue this for 2 wks and recheck.  2) Urinary hesitancy and poor stream: no response to abx for suspected chronic prostatitis. Will do trial of flomax for BPH with lower urinary tract obstructive sx's: 0.4mg  qhs.  Therapeutic expectations and side effect profile of medication discussed today.  Patient's questions answered.  An After Visit Summary was printed and given to the patient.  FOLLOW UP: Return in about 2 weeks (  around 10/31/2019) for f/u urinary prob and neuropathic pain.  Signed:  Santiago Bumpers, MD           10/17/2019

## 2019-11-07 ENCOUNTER — Other Ambulatory Visit: Payer: Self-pay

## 2019-11-07 ENCOUNTER — Ambulatory Visit (INDEPENDENT_AMBULATORY_CARE_PROVIDER_SITE_OTHER): Payer: 59 | Admitting: Family Medicine

## 2019-11-07 ENCOUNTER — Encounter: Payer: Self-pay | Admitting: Family Medicine

## 2019-11-07 VITALS — BP 117/74 | HR 72 | Temp 98.0°F | Resp 16 | Ht 71.5 in | Wt 207.8 lb

## 2019-11-07 DIAGNOSIS — M792 Neuralgia and neuritis, unspecified: Secondary | ICD-10-CM

## 2019-11-07 DIAGNOSIS — R3911 Hesitancy of micturition: Secondary | ICD-10-CM | POA: Diagnosis not present

## 2019-11-07 MED ORDER — FINASTERIDE 5 MG PO TABS
5.0000 mg | ORAL_TABLET | Freq: Every day | ORAL | 1 refills | Status: DC
Start: 2019-11-07 — End: 2019-12-10

## 2019-11-07 NOTE — Progress Notes (Signed)
OFFICE VISIT  11/07/2019   CC:  Chief Complaint  Patient presents with  . Follow-up    urinary, neuropathic pain   HPI:    Patient is a 49 y.o. Caucasian male who presents for 3 wk f/u A/P as of last visit: "1) Chronic LBP with bilat legs neuropathic pain: slightly improved on gabapentin initiation, tolerating 200 tid.  Increase to 300 mg tid and continue this for 2 wks and recheck.  2) Urinary hesitancy and poor stream: no response to abx for suspected chronic prostatitis. Will do trial of flomax for BPH with lower urinary tract obstructive sx's: 0.4mg  qhs."  INTERIM HX: No signif change in his nerve pain or his urinary complaints. No side effects from either the flomax or gabapentin.  Still with chronic slow/weak urine flow, hesitancy, starting and stopping.  No urinary urgency or frequency, no dysuria, no fevers or flank pain. Still with chronic burning in both legs of moderate severity.    No loss of bowel or bladder control.  No feeling of weakness in legs.  No saddle anesthesia.  Past Medical History:  Diagnosis Date  . Anxiety   . Bipolar II disorder (HCC)   . Chronic headaches   . Chronic low back pain    with chronic radiculopathy pain.  Has seen neurosurgeon, got injections which did not help.  Pt does not want to get surgery.  . Chronic sinusitis   . GERD (gastroesophageal reflux disease)    with schatski's ring  . Hay fever   . History of prostatitis    BPH with LUTS--failed proscar and flomax.  Pt reports cyst normal.  . Hypertriglyceridemia    +mildly low HDL.  No meds.  . Insomnia   . Obesity, Class I, BMI 30-34.9   . OSA (obstructive sleep apnea) 07/2008   Hx of intolerance to CPAP sedondary to anxiety/max intolerance    Past Surgical History:  Procedure Laterality Date  . lumbar back surg  2010    Outpatient Medications Prior to Visit  Medication Sig Dispense Refill  . aspirin-acetaminophen-caffeine (EXCEDRIN MIGRAINE) 250-250-65 MG per tablet  Take 1 tablet by mouth every 6 (six) hours as needed.    . cetirizine (ZYRTEC) 10 MG tablet Take 10 mg by mouth daily.    Marland Kitchen FLUoxetine (PROZAC) 20 MG capsule TAKE 1 CAPSULE BY MOUTH 3  TIMES DAILY 270 capsule 1  . lamoTRIgine (LAMICTAL) 100 MG tablet TAKE 1 TABLET BY MOUTH  TWICE DAILY 180 tablet 1  . Naproxen Sodium (ALEVE) 220 MG CAPS Take 1 capsule by mouth as needed.    . traZODone (DESYREL) 50 MG tablet Take 1-2 tablets (50-100 mg total) by mouth at bedtime as needed. for sleep 180 tablet 3  . zolpidem (AMBIEN) 10 MG tablet 1 tab po qhs 90 tablet 1  . gabapentin (NEURONTIN) 300 MG capsule Take 1 capsule (300 mg total) by mouth 3 (three) times daily. 90 capsule 0  . tamsulosin (FLOMAX) 0.4 MG CAPS capsule Take 1 capsule (0.4 mg total) by mouth daily. 30 capsule 0  . albuterol (PROAIR HFA) 108 (90 BASE) MCG/ACT inhaler Inhale 2 puffs into the lungs every 6 (six) hours as needed for wheezing or shortness of breath. (Patient not taking: Reported on 09/26/2019) 1 Inhaler 1  . Azelastine-Fluticasone (DYMISTA) 137-50 MCG/ACT SUSP Place 2 sprays into the nose 2 (two) times daily. (Patient not taking: Reported on 09/26/2019) 3 Bottle 3  . TDaP (BOOSTRIX) injection 0.5 mL      No  facility-administered medications prior to visit.    Allergies  Allergen Reactions  . Clonazepam Other (See Comments)    manic  . Effexor [Venlafaxine Hcl] Other (See Comments)    "bad side effects"  . Risperdal [Risperidone] Other (See Comments)    "ineffective + "side effects"  . Stelazine [Trifluoperazine] Other (See Comments)    "terrible, awful side effects"  . Zyprexa [Olanzapine] Other (See Comments)    "terrible, awful side effects"    ROS As per HPI  PE: Blood pressure 117/74, pulse 72, temperature 98 F (36.7 C), temperature source Oral, resp. rate 16, height 5' 11.5" (1.816 m), weight 207 lb 12.8 oz (94.3 kg), SpO2 97 %. Gen: Alert, well appearing.  Patient is oriented to person, place, time, and  situation. AFFECT: pleasant, lucid thought and speech. No further exam today.  LABS:    Chemistry      Component Value Date/Time   NA 139 09/26/2019 0859   K 5.1 09/26/2019 0859   CL 102 09/26/2019 0859   CO2 30 09/26/2019 0859   BUN 19 09/26/2019 0859   CREATININE 1.23 09/26/2019 0859   CREATININE 1.22 11/08/2012 1613      Component Value Date/Time   CALCIUM 10.1 09/26/2019 0859   ALKPHOS 68 09/26/2019 0859   AST 17 09/26/2019 0859   ALT 21 09/26/2019 0859   BILITOT 0.4 09/26/2019 0859      IMPRESSION AND PLAN:  1) Urine outflow impairment: no improvement s/p empiric abx for possible chronic prostatitis, no improvement with 1 mo trial of flomax. Will do trial of finasteride but if no help than this may not be a bph issue.  Question of neurogenic bladder sx's from is hx of L spine probs (+ chronic bilat leg neuropathic pain).  2) Chronic bilat legs neuropathic pain: No improvement with adequate duration and dosing of gabapentin. Wean off of this. No new med at this time. He will consider referral to pain mgmt (consideration of spinal stimulator) in future.  An After Visit Summary was printed and given to the patient.  FOLLOW UP: Return in about 4 weeks (around 12/05/2019) for f/u urinary issue.  Signed:  Santiago Bumpers, MD           11/07/2019

## 2019-11-08 ENCOUNTER — Other Ambulatory Visit: Payer: Self-pay | Admitting: Family Medicine

## 2019-11-27 ENCOUNTER — Other Ambulatory Visit: Payer: Self-pay | Admitting: Family Medicine

## 2019-11-27 NOTE — Telephone Encounter (Signed)
RF request for trazadone.  Last OV 11/07/19 Next OV 12/10/19 Last RF 10/25/2018 # 180 x 3 rfs.  Please advise.

## 2019-11-30 ENCOUNTER — Other Ambulatory Visit: Payer: Self-pay | Admitting: Family Medicine

## 2019-12-06 ENCOUNTER — Ambulatory Visit: Payer: 59 | Admitting: Family Medicine

## 2019-12-10 ENCOUNTER — Encounter: Payer: Self-pay | Admitting: Family Medicine

## 2019-12-10 ENCOUNTER — Ambulatory Visit (INDEPENDENT_AMBULATORY_CARE_PROVIDER_SITE_OTHER): Payer: 59 | Admitting: Family Medicine

## 2019-12-10 ENCOUNTER — Other Ambulatory Visit: Payer: Self-pay

## 2019-12-10 VITALS — BP 95/59 | HR 58 | Temp 98.0°F | Resp 16 | Ht 71.5 in | Wt 208.2 lb

## 2019-12-10 DIAGNOSIS — N320 Bladder-neck obstruction: Secondary | ICD-10-CM | POA: Diagnosis not present

## 2019-12-10 DIAGNOSIS — G4700 Insomnia, unspecified: Secondary | ICD-10-CM | POA: Diagnosis not present

## 2019-12-10 DIAGNOSIS — Z23 Encounter for immunization: Secondary | ICD-10-CM

## 2019-12-10 DIAGNOSIS — Z79899 Other long term (current) drug therapy: Secondary | ICD-10-CM | POA: Diagnosis not present

## 2019-12-10 NOTE — Progress Notes (Addendum)
OFFICE VISIT  12/10/2019  CC:  Chief Complaint  Patient presents with   Follow-up    urinary issues, he states things are about the same    HPI:    Patient is a 49 y.o. Caucasian male who presents for 1 mo f/u urinary symptoms, working dx BPH with LUT obstructive sx's. A/P as of last visit: "1) Urine outflow impairment: no improvement s/p empiric abx for possible chronic prostatitis, no improvement with 1 mo trial of flomax. Will do trial of finasteride but if no help than this may not be a bph issue.  Question of neurogenic bladder sx's from is hx of L spine probs (+ chronic bilat leg neuropathic pain).  2) Chronic bilat legs neuropathic pain: No improvement with adequate duration and dosing of gabapentin. Wean off of this. No new med at this time. He will consider referral to pain mgmt (consideration of spinal stimulator) in future.   INTERIM HX: NO change. Has dealt with this a LONG time, had a disappointing urol eval 2016 and is hesitant to go and get another eval. He has never had a renal u/s that he can recall and I see no record of one in the EMR.  GFR has been stead in the 70s since I started seeing him in 2014.  Past Medical History:  Diagnosis Date   Anxiety    Bipolar II disorder (HCC)    Chronic headaches    Chronic low back pain    with chronic radiculopathy pain.  Has seen neurosurgeon, got injections which did not help.  Pt does not want to get surgery.   Chronic sinusitis    GERD (gastroesophageal reflux disease)    with schatski's ring   Hay fever    History of prostatitis    BPH with LUTS--failed proscar and flomax.  Pt reports cyst normal.   Hypertriglyceridemia    +mildly low HDL.  No meds.   Insomnia    Obesity, Class I, BMI 30-34.9    OSA (obstructive sleep apnea) 07/2008   Hx of intolerance to CPAP sedondary to anxiety/max intolerance    Past Surgical History:  Procedure Laterality Date   lumbar back surg  2010  Cystoscopy  05/2014 NORMAL per pt.  Outpatient Medications Prior to Visit  Medication Sig Dispense Refill   aspirin-acetaminophen-caffeine (EXCEDRIN MIGRAINE) 250-250-65 MG per tablet Take 1 tablet by mouth every 6 (six) hours as needed.     cetirizine (ZYRTEC) 10 MG tablet Take 10 mg by mouth daily.     FLUoxetine (PROZAC) 20 MG capsule TAKE 1 CAPSULE BY MOUTH 3  TIMES DAILY 270 capsule 1   lamoTRIgine (LAMICTAL) 100 MG tablet TAKE 1 TABLET BY MOUTH  TWICE DAILY 180 tablet 1   Naproxen Sodium (ALEVE) 220 MG CAPS Take 1 capsule by mouth as needed.     traZODone (DESYREL) 50 MG tablet TAKE 1 TO 2 TABLETS BY  MOUTH AT BEDTIME AS NEEDED. FOR SLEEP 180 tablet 3   zolpidem (AMBIEN) 10 MG tablet 1 tab po qhs 90 tablet 1   finasteride (PROSCAR) 5 MG tablet Take 1 tablet (5 mg total) by mouth daily. 30 tablet 1   albuterol (PROAIR HFA) 108 (90 BASE) MCG/ACT inhaler Inhale 2 puffs into the lungs every 6 (six) hours as needed for wheezing or shortness of breath. (Patient not taking: Reported on 09/26/2019) 1 Inhaler 1   Azelastine-Fluticasone (DYMISTA) 137-50 MCG/ACT SUSP Place 2 sprays into the nose 2 (two) times daily. (Patient not taking: Reported  on 09/26/2019) 3 Bottle 3   No facility-administered medications prior to visit.    Allergies  Allergen Reactions   Clonazepam Other (See Comments)    manic   Effexor [Venlafaxine Hcl] Other (See Comments)    "bad side effects"   Risperdal [Risperidone] Other (See Comments)    "ineffective + "side effects"   Stelazine [Trifluoperazine] Other (See Comments)    "terrible, awful side effects"   Zyprexa [Olanzapine] Other (See Comments)    "terrible, awful side effects"    ROS As per HPI  PE: Vitals with BMI 12/10/2019 11/07/2019 10/17/2019  Height 5' 11.5" 5' 11.5" 5' 11.5"  Weight 208 lbs 3 oz 207 lbs 13 oz 209 lbs 6 oz  BMI 28.64 28.58 28.8  Systolic 95 117 107  Diastolic 59 74 73  Pulse 58 72 59     Gen: Alert, well appearing.  Patient is  oriented to person, place, time, and situation. AFFECT: pleasant, lucid thought and speech. No further exam today.  LABS:    Chemistry      Component Value Date/Time   NA 139 09/26/2019 0859   K 5.1 09/26/2019 0859   CL 102 09/26/2019 0859   CO2 30 09/26/2019 0859   BUN 19 09/26/2019 0859   CREATININE 1.23 09/26/2019 0859   CREATININE 1.22 11/08/2012 1613      Component Value Date/Time   CALCIUM 10.1 09/26/2019 0859   ALKPHOS 68 09/26/2019 0859   AST 17 09/26/2019 0859   ALT 21 09/26/2019 0859   BILITOT 0.4 09/26/2019 0859     Lab Results  Component Value Date   WBC 6.8 09/26/2019   HGB 15.0 09/26/2019   HCT 44.9 09/26/2019   MCV 86.7 09/26/2019   PLT 252.0 09/26/2019   Urine clx neg 09/26/19  IMPRESSION AND PLAN:  1) Urinary obstructive sx's c/w BPH. Urethral stricture much less likely. No response to flomax or finasteride in last couple months OR in remote past. Cysto normal per pt report (back in 2016). He declines another urology eval for now but I do want to get renal u/s to make sure no sign of hydronephrosis from chronic lower urinary tract obstruction. Ordered this today.  2) Chronic insomnia, responds well to [place]. OK to continue this nightly. Will continue to RF med up until 6 mo from now when I'll see him back to follow this up and do CPE.  An After Visit Summary was printed and given to the patient.  FOLLOW UP: Return in about 6 months (around 06/08/2020) for annual CPE (fasting) + f/u insomnia/med.  Signed:  Santiago Bumpers, MD           12/10/2019

## 2019-12-10 NOTE — Addendum Note (Signed)
Addended by: Emi Holes D on: 12/10/2019 01:46 PM   Modules accepted: Orders

## 2019-12-17 ENCOUNTER — Other Ambulatory Visit: Payer: Self-pay

## 2019-12-17 ENCOUNTER — Ambulatory Visit (INDEPENDENT_AMBULATORY_CARE_PROVIDER_SITE_OTHER): Payer: 59

## 2019-12-17 DIAGNOSIS — N320 Bladder-neck obstruction: Secondary | ICD-10-CM | POA: Diagnosis not present

## 2020-01-04 ENCOUNTER — Encounter: Payer: Self-pay | Admitting: Family Medicine

## 2020-01-04 NOTE — Telephone Encounter (Signed)
Patient was last seen 9/27, US renal completed and returned normal. It is mentioned in o/v, "No response to flomax or finasteride in last couple months OR in remote past. Cysto normal per pt report (back in 2016). He declines another urology eval for now but I do want to get renal u/s to make sure no sign of hydronephrosis from chronic lower urinary tract obstruction. Ordered this today".  Please advise on message below from patient:  "I stopped the finastresie and things seem to be worse.    Maybe it was helping more than I thought.    Is this something worth restarting?"

## 2020-01-05 NOTE — Telephone Encounter (Signed)
Yes, if worse off finasteride then get back on finasteride. If pt needs more of this med pls eRx finasteride 5mg , 1 tab po qd, #30, RF x 6.-thx

## 2020-01-26 ENCOUNTER — Other Ambulatory Visit: Payer: Self-pay | Admitting: Family Medicine

## 2020-01-28 ENCOUNTER — Other Ambulatory Visit: Payer: Self-pay

## 2020-01-28 MED ORDER — FINASTERIDE 5 MG PO TABS
5.0000 mg | ORAL_TABLET | Freq: Every day | ORAL | 5 refills | Status: DC
Start: 2020-01-28 — End: 2020-06-13

## 2020-02-12 ENCOUNTER — Other Ambulatory Visit: Payer: Self-pay | Admitting: Family Medicine

## 2020-06-10 ENCOUNTER — Other Ambulatory Visit: Payer: Self-pay

## 2020-06-11 ENCOUNTER — Encounter: Payer: 59 | Admitting: Family Medicine

## 2020-06-11 ENCOUNTER — Telehealth: Payer: Self-pay

## 2020-06-11 DIAGNOSIS — Z0000 Encounter for general adult medical examination without abnormal findings: Secondary | ICD-10-CM

## 2020-06-11 DIAGNOSIS — E781 Pure hyperglyceridemia: Secondary | ICD-10-CM

## 2020-06-11 DIAGNOSIS — N411 Chronic prostatitis: Secondary | ICD-10-CM

## 2020-06-11 NOTE — Telephone Encounter (Signed)
Appt rescheduled to 4/1  [facility] Night - Client Nonclinical Telephone Record  [facility] Client [place] [facility] Night - Client Client Site [facility] [facility] Night Physician Santiago Bumpers - MD Contact Type Call Who Is Calling Patient / Member / Family / Caregiver Caller Name [name] Caller Phone Number [phone] Patient Name Alex Hall Patient DOB 12/02/1970 Call Type Message Only Information Provided Reason for Call Request to Reschedule Office Appointment Initial Comment Caller states he is returning call from office to reschedule Additional Comment hrs prov Disp. Time Disposition Final User 06/11/2020 7:24:12 AM General Information Provided Yes Pearletha Furl Call Closed By: Pearletha Furl Transaction Date/Time: 06/11/2020 7:22:36 AM (ET)

## 2020-06-13 ENCOUNTER — Other Ambulatory Visit: Payer: Self-pay

## 2020-06-13 ENCOUNTER — Encounter: Payer: Self-pay | Admitting: Family Medicine

## 2020-06-13 ENCOUNTER — Ambulatory Visit (INDEPENDENT_AMBULATORY_CARE_PROVIDER_SITE_OTHER): Payer: 59 | Admitting: Family Medicine

## 2020-06-13 VITALS — BP 112/67 | HR 59 | Temp 97.8°F | Resp 16 | Ht 70.47 in | Wt 211.2 lb

## 2020-06-13 DIAGNOSIS — N411 Chronic prostatitis: Secondary | ICD-10-CM | POA: Diagnosis not present

## 2020-06-13 DIAGNOSIS — N401 Enlarged prostate with lower urinary tract symptoms: Secondary | ICD-10-CM

## 2020-06-13 DIAGNOSIS — G43009 Migraine without aura, not intractable, without status migrainosus: Secondary | ICD-10-CM

## 2020-06-13 DIAGNOSIS — Z0000 Encounter for general adult medical examination without abnormal findings: Secondary | ICD-10-CM

## 2020-06-13 DIAGNOSIS — N138 Other obstructive and reflux uropathy: Secondary | ICD-10-CM

## 2020-06-13 DIAGNOSIS — E781 Pure hyperglyceridemia: Secondary | ICD-10-CM

## 2020-06-13 DIAGNOSIS — G4700 Insomnia, unspecified: Secondary | ICD-10-CM

## 2020-06-13 DIAGNOSIS — F3181 Bipolar II disorder: Secondary | ICD-10-CM

## 2020-06-13 LAB — LIPID PANEL
Cholesterol: 166 mg/dL (ref 0–200)
HDL: 36.9 mg/dL — ABNORMAL LOW (ref >39.00)
LDL Cholesterol: 95 mg/dL (ref 0–99)
NonHDL: 128.7
Total CHOL/HDL Ratio: 4
Triglycerides: 171 mg/dL — ABNORMAL HIGH (ref 0.0–149.0)
VLDL: 34.2 mg/dL (ref 0.0–40.0)

## 2020-06-13 LAB — COMPREHENSIVE METABOLIC PANEL
ALT: 26 U/L (ref 0–53)
AST: 27 U/L (ref 0–37)
Albumin: 4.4 g/dL (ref 3.5–5.2)
Alkaline Phosphatase: 58 U/L (ref 39–117)
BUN: 18 mg/dL (ref 6–23)
CO2: 28 mEq/L (ref 19–32)
Calcium: 9.4 mg/dL (ref 8.4–10.5)
Chloride: 105 mEq/L (ref 96–112)
Creatinine, Ser: 1.19 mg/dL (ref 0.40–1.50)
GFR: 72.01 mL/min (ref >60.00)
Glucose, Bld: 79 mg/dL (ref 70–99)
Potassium: 4.4 mEq/L (ref 3.5–5.1)
Sodium: 140 mEq/L (ref 135–145)
Total Bilirubin: 0.4 mg/dL (ref 0.2–1.2)
Total Protein: 7.3 g/dL (ref 6.0–8.3)

## 2020-06-13 MED ORDER — LAMOTRIGINE 100 MG PO TABS
100.0000 mg | ORAL_TABLET | Freq: Two times a day (BID) | ORAL | 3 refills | Status: AC
Start: 2020-06-13 — End: ?

## 2020-06-13 MED ORDER — RIZATRIPTAN BENZOATE 10 MG PO TABS: 10.0000 mg | ORAL_TABLET | ORAL | 1 refills | Status: DC | PRN

## 2020-06-13 MED ORDER — FLUOXETINE HCL 20 MG PO CAPS: 20.0000 mg | ORAL_CAPSULE | Freq: Three times a day (TID) | ORAL | 3 refills | Status: AC

## 2020-06-13 MED ORDER — CETIRIZINE HCL 10 MG PO TABS: 10.0000 mg | ORAL_TABLET | Freq: Every day | ORAL | 3 refills | Status: AC

## 2020-06-13 MED ORDER — TRAZODONE HCL 50 MG PO TABS: 50.0000 mg | ORAL_TABLET | Freq: Every evening | ORAL | 3 refills | Status: AC | PRN

## 2020-06-13 MED ORDER — FINASTERIDE 5 MG PO TABS: 5.0000 mg | ORAL_TABLET | Freq: Every day | ORAL | 3 refills | Status: AC

## 2020-06-13 MED ORDER — ZOLPIDEM TARTRATE 10 MG PO TABS: ORAL_TABLET | ORAL | 1 refills | Status: DC

## 2020-06-13 NOTE — Patient Instructions (Signed)

## 2020-06-13 NOTE — Progress Notes (Signed)
Office Note 06/13/2020  CC:  Chief Complaint  Patient presents with  . Annual Exam    Fasting     HPI:  Alex Hall is a 50 y.o. White male who is here for annual health maintenance exam and 6 mo f/u insomnia, bipolar II disorder. A/P as of last visit 6 mo ago: "1) Urinary obstructive sx's c/w BPH. Urethral stricture much less likely. No response to flomax or finasteride in last couple months OR in remote past. Cysto normal per pt report (back in 2016). He declines another urology eval for now but I do want to get renal u/s to make sure no sign of hydronephrosis from chronic lower urinary tract obstruction. Ordered this today.  2) Chronic insomnia, responds well to Villanueva. OK to continue this nightly. Will continue to RF med up until 6 mo from now when I'll see him back to follow this up and do CPE.  INTERIM HX: Renal u/s was normal last visit. Lots going on, has mood down-->dad with 2 cancer dx lately+treatments, etc, pt's job potentially moving to [place], his divorce is final.   He is pushing through.  No ETOH.   He had a falling out with his counselor.  No SI or HI. Has still been taking 60mg  fluox qd, 200 mg lamictal qd.  More migraines lately: all over HA, sensitive to light and noise, +nausea. About 2 times a month, used to be on maxalt.  Takes regularly, lately just 1/2 tab b/c rationing b/c running out.  Also on traz 50mg , 1-2 qhs. PMP AWARE reviewed today: most recent rx for ambien 10mg  was filled 12/19/19, # 90, rx by me. No red flags.   Past Medical History:  Diagnosis Date  . Anxiety   . Bipolar II disorder (HCC)   . Chronic headaches   . Chronic low back pain    with chronic radiculopathy pain.  Has seen neurosurgeon, got injections which did not help.  Pt does not want to get surgery.  . Chronic sinusitis   . GERD (gastroesophageal reflux disease)    with schatski's ring  . Hay fever   . History of prostatitis    BPH with LUTS--failed  proscar and flomax.  Pt reports cyst normal.  . Hypertriglyceridemia    +mildly low HDL.  No meds.  . Insomnia   . Obesity, Class I, BMI 30-34.9   . OSA (obstructive sleep apnea) 07/2008   Hx of intolerance to CPAP sedondary to anxiety/max intolerance    Past Surgical History:  Procedure Laterality Date  . lumbar back surg  2010    Family History  Problem Relation Age of Onset  . CVA Father   . Other Father        colon cancer sugery Mid March 2021   . Arthritis-Osteo Mother     Social History   Socioeconomic History  . Marital status: Divorced    Spouse name: Not on file  . Number of children: Not on file  . Years of education: Not on file  . Highest education level: Not on file  Occupational History  . Occupation: 08/2008  Tobacco Use  . Smoking status: Never Smoker  . Smokeless tobacco: Never Used  Substance and Sexual Activity  . Alcohol use: No  . Drug use: No  . Sexual activity: Not on file  Other Topics Concern  . Not on file  Social History Narrative   Divorced x 2.  One stepdaughter.   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   College educated.   Normal american diet.  No exercise.   No T/A/Ds.   Social Determinants of Health   Financial Resource Strain: Not on file  Food Insecurity: Not on file  Transportation Needs: Not on file  Physical Activity: Not on file  Stress: Not on file  Social Connections: Not on file  Intimate Partner Violence: Not on file    Outpatient Medications Prior to Visit  Medication Sig Dispense Refill  . aspirin-acetaminophen-caffeine (EXCEDRIN MIGRAINE) 250-250-65 MG per tablet Take 1 tablet by mouth every 6 (six) hours as needed.    . Naproxen Sodium 220 MG CAPS Take 1 capsule by mouth as needed.    . cetirizine (ZYRTEC) 10 MG tablet Take 10 mg by mouth daily.    . finasteride (PROSCAR) 5 MG tablet Take 1 tablet (5 mg total) by mouth daily. 30 tablet 5  . FLUoxetine  (PROZAC) 20 MG capsule TAKE 1 CAPSULE BY MOUTH 3  TIMES DAILY 270 capsule 1  . lamoTRIgine (LAMICTAL) 100 MG tablet TAKE 1 TABLET BY MOUTH  TWICE DAILY 180 tablet 1  . traZODone (DESYREL) 50 MG tablet TAKE 1 TO 2 TABLETS BY  MOUTH AT BEDTIME AS NEEDED. FOR SLEEP 180 tablet 3  . zolpidem (AMBIEN) 10 MG tablet 1 tab po qhs 90 tablet 1   No facility-administered medications prior to visit.    Allergies  Allergen Reactions  . Clonazepam Other (See Comments)    manic  . Effexor [Venlafaxine Hcl] Other (See Comments)    "bad side effects"  . Risperdal [Risperidone] Other (See Comments)    "ineffective + "side effects"  . Stelazine [Trifluoperazine] Other (See Comments)    "terrible, awful side effects"  . Zyprexa [Olanzapine] Other (See Comments)    "terrible, awful side effects"    ROS Review of Systems  Constitutional: Negative for appetite change, chills, fatigue and fever.  HENT: Negative for congestion, dental problem, ear pain and sore throat.   Eyes: Negative for discharge, redness and visual disturbance.  Respiratory: Negative for cough, chest tightness, shortness of breath and wheezing.   Cardiovascular: Negative for chest pain, palpitations and leg swelling.  Gastrointestinal: Negative for abdominal pain, blood in stool, diarrhea, nausea and vomiting.  Genitourinary: Negative for difficulty urinating, dysuria, flank pain, frequency, hematuria and urgency.  Musculoskeletal: Negative for arthralgias, back pain, joint swelling, myalgias and neck stiffness.  Skin: Negative for pallor and rash.  Neurological: Negative for dizziness, speech difficulty, weakness and headaches.  Hematological: Negative for adenopathy. Does not bruise/bleed easily.  Psychiatric/Behavioral: Negative for confusion and sleep disturbance. The patient is not nervous/anxious.     PE; Vitals with BMI 06/13/2020 12/10/2019 11/07/2019  Height 5' 10.472" 5' 11.5" 5' 11.5"  Weight 211 lbs 3 oz 208 lbs 3 oz 207  lbs 13 oz  BMI 29.9 28.64 28.58  Systolic 112 95 117  Diastolic 67 59 74  Pulse 59 58 72     Gen: Alert, well appearing.  Patient is oriented to person, place, time, and situation. AFFECT: pleasant, lucid thought and speech. ENT: Ears: EACs clear, normal epithelium.  TMs with good light reflex and landmarks bilaterally.  Eyes: no injection, icteris, swelling, or exudate.  EOMI, PERRLA. Nose: no drainage or turbinate edema/swelling.  No injection or focal lesion.  Mouth: lips without lesion/swelling.  Oral mucosa pink and moist.  Dentition intact and without obvious caries or gingival swelling.  Oropharynx without erythema, exudate, or swelling.  Neck:  supple/nontender.  No LAD, mass, or TM.  Carotid pulses 2+ bilaterally, without bruits. CV: RRR, no m/r/g.   LUNGS: CTA bilat, nonlabored resps, good aeration in all lung fields. ABD: soft, NT, ND, BS normal.  No hepatospenomegaly or mass.  No bruits. EXT: no clubbing, cyanosis, or edema.  Musculoskeletal: no joint swelling, erythema, warmth, or tenderness.  ROM of all joints intact. Skin - no sores or suspicious lesions or rashes or color changes   Pertinent labs:  Lab Results  Component Value Date   TSH 2.54 03/31/2017   Lab Results  Component Value Date   WBC 6.8 09/26/2019   HGB 15.0 09/26/2019   HCT 44.9 09/26/2019   MCV 86.7 09/26/2019   PLT 252.0 09/26/2019   Lab Results  Component Value Date   CREATININE 1.23 09/26/2019   BUN 19 09/26/2019   NA 139 09/26/2019   K 5.1 09/26/2019   CL 102 09/26/2019   CO2 30 09/26/2019   Lab Results  Component Value Date   ALT 21 09/26/2019   AST 17 09/26/2019   ALKPHOS 68 09/26/2019   BILITOT 0.4 09/26/2019   Lab Results  Component Value Date   CHOL 147 09/26/2019   Lab Results  Component Value Date   HDL 36.90 (L) 09/26/2019   Lab Results  Component Value Date   LDLCALC 82 04/06/2012   Lab Results  Component Value Date   TRIG 210.0 (H) 09/26/2019   Lab Results   Component Value Date   CHOLHDL 4 09/26/2019   Lab Results  Component Value Date   PSA 1.03 09/26/2019    ASSESSMENT AND PLAN:   1) Insomnia and bipolar II: struggling with situational depression last few months but is hanging in there, no changes today.   He declined new counseling referral. [place] today.  2) Migraine HAs: about 2 per mo so daily preventative tx not indicated. Pt responded well to maxalt in the past so I'll rx this today.  3) BPH with chronic lower urinary tract obst sx's: he actually has found finasteride 5mg  qd to help some so we'll cont this.  4) Health maintenance exam: Reviewed age and gender appropriate health maintenance issues (prudent diet, regular exercise, health risks of tobacco and excessive alcohol, use of seatbelts, fire alarms in home, use of sunscreen).  Also reviewed age and gender appropriate health screening as well as vaccine recommendations. Vaccines: ALL UTD. Labs: CMET and FLP (hypertriglyceridemia, DM screening). Prostate ca screening:  PSA normal 09/2019, plan repeat in 6 mo. Colon ca screening: average risk patient= as per latest guidelines, start screening at any time now-->options reviewed->he declines for now.  An After Visit Summary was printed and given to the patient.  FOLLOW UP:  Return in about 6 months (around 12/13/2020) for routine chronic illness f/u.  Signed:  02/12/2021, MD           06/13/2020

## 2020-11-11 ENCOUNTER — Other Ambulatory Visit: Payer: Self-pay | Admitting: Family Medicine

## 2020-11-19 ENCOUNTER — Other Ambulatory Visit: Payer: Self-pay | Admitting: Family Medicine

## 2020-12-16 ENCOUNTER — Encounter: Payer: Self-pay | Admitting: Family Medicine

## 2020-12-16 ENCOUNTER — Ambulatory Visit (INDEPENDENT_AMBULATORY_CARE_PROVIDER_SITE_OTHER): Payer: 59 | Admitting: Family Medicine

## 2020-12-16 ENCOUNTER — Other Ambulatory Visit: Payer: Self-pay

## 2020-12-16 VITALS — BP 104/70 | HR 77 | Temp 98.1°F | Ht 70.47 in | Wt 208.4 lb

## 2020-12-16 DIAGNOSIS — Z23 Encounter for immunization: Secondary | ICD-10-CM | POA: Diagnosis not present

## 2020-12-16 DIAGNOSIS — N401 Enlarged prostate with lower urinary tract symptoms: Secondary | ICD-10-CM

## 2020-12-16 DIAGNOSIS — F3181 Bipolar II disorder: Secondary | ICD-10-CM

## 2020-12-16 DIAGNOSIS — F5104 Psychophysiologic insomnia: Secondary | ICD-10-CM

## 2020-12-16 DIAGNOSIS — Z125 Encounter for screening for malignant neoplasm of prostate: Secondary | ICD-10-CM

## 2020-12-16 DIAGNOSIS — G43909 Migraine, unspecified, not intractable, without status migrainosus: Secondary | ICD-10-CM | POA: Diagnosis not present

## 2020-12-16 DIAGNOSIS — N138 Other obstructive and reflux uropathy: Secondary | ICD-10-CM

## 2020-12-16 MED ORDER — ZOLPIDEM TARTRATE 10 MG PO TABS
ORAL_TABLET | ORAL | 1 refills | Status: AC
Start: 2020-12-16 — End: ?

## 2020-12-16 MED ORDER — RIZATRIPTAN BENZOATE 10 MG PO TABS
ORAL_TABLET | ORAL | 2 refills | Status: AC
Start: 2020-12-16 — End: ?

## 2020-12-16 NOTE — Progress Notes (Signed)
OFFICE VISIT  12/16/2020  CC:  Chief Complaint  Patient presents with   Follow-up    RCI, pt is fasting   HPI:    Patient is a 50 y.o. Caucasian male who presents for 6 mo f/u bipolar d/o, insomnia, migraine HAs, and bph with chronic LUT obst sx's. A/P as of last visit: "1) Insomnia and bipolar II: struggling with situational depression last few months but is hanging in there, no changes today.   He declined new counseling referral. [place] today.   2) Migraine HAs: about 2 per mo so daily preventative tx not indicated. Pt responded well to maxalt in the past so I'll rx this today.   3) BPH with chronic lower urinary tract obst sx's: he actually has found finasteride 5mg  qd to help some so we'll cont this.   4) Health maintenance exam: Reviewed age and gender appropriate health maintenance issues (prudent diet, regular exercise, health risks of tobacco and excessive alcohol, use of seatbelts, fire alarms in home, use of sunscreen).  Also reviewed age and gender appropriate health screening as well as vaccine recommendations. Vaccines: ALL UTD. Labs: CMET and FLP (hypertriglyceridemia, DM screening). Prostate ca screening:  PSA normal 09/2019, plan repeat in 6 mo. Colon ca screening: average risk patient= as per latest guidelines, start screening at any time now-->options reviewed->he declines for now."  INTERIM HX: All labs last visit were good. He is moving away to [place] in about a month, today is last planned visit with me. He's stable from anxiety/mood standpoint, seems to be coping with the changes well. Sleep is good as long as he takes trazodone and ambien nightly. Still with some mild BPH sx's that he has found proscar to be helpful for. Migraines: about 2 per month, takes excedrin as first line abortive, then maxalt if this doesn't help.   PMP AWARE reviewed today: most recent rx for ambien was filled 08/25/20, # 90, rx by me. No red flags.   Past  Medical History:  Diagnosis Date   Anxiety    Bipolar II disorder (HCC)    Chronic headaches    Chronic low back pain    with chronic radiculopathy pain.  Has seen neurosurgeon, got injections which did not help.  Pt does not want to get surgery.   Chronic sinusitis    GERD (gastroesophageal reflux disease)    with schatski's ring   Hay fever    History of prostatitis    BPH with LUTS--failed proscar and flomax.  Pt reports cyst normal.   Hypertriglyceridemia    +mildly low HDL.  No meds.   Insomnia    Obesity, Class I, BMI 30-34.9    OSA (obstructive sleep apnea) 07/2008   Hx of intolerance to CPAP sedondary to anxiety/max intolerance    Past Surgical History:  Procedure Laterality Date   lumbar back surg  2010    Outpatient Medications Prior to Visit  Medication Sig Dispense Refill   aspirin-acetaminophen-caffeine (EXCEDRIN MIGRAINE) 250-250-65 MG per tablet Take 1 tablet by mouth every 6 (six) hours as needed.     cetirizine (ZYRTEC) 10 MG tablet Take 1 tablet (10 mg total) by mouth daily. 90 tablet 3   finasteride (PROSCAR) 5 MG tablet Take 1 tablet (5 mg total) by mouth daily. 90 tablet 3   FLUoxetine (PROZAC) 20 MG capsule Take 1 capsule (20 mg total) by mouth 3 (three) times daily. 270 capsule 3   lamoTRIgine (LAMICTAL) 100 MG tablet Take 1  tablet (100 mg total) by mouth 2 (two) times daily. 180 tablet 3   Naproxen Sodium 220 MG CAPS Take 1 capsule by mouth as needed.     traZODone (DESYREL) 50 MG tablet Take 1-2 tablets (50-100 mg total) by mouth at bedtime as needed. for sleep 180 tablet 3   rizatriptan (MAXALT) 10 MG tablet TAKE 1 TABLET AS NEEDED FORMIGRAINE.MAY REPEAT IN 2   HOURS IF NEEDED 10 tablet 1   zolpidem (AMBIEN) 10 MG tablet 1 tab po qhs 90 tablet 1   No facility-administered medications prior to visit.    Allergies  Allergen Reactions   Clonazepam Other (See Comments)    manic   Effexor [Venlafaxine Hcl] Other (See Comments)    "bad side effects"    Risperdal [Risperidone] Other (See Comments)    "ineffective + "side effects"   Stelazine [Trifluoperazine] Other (See Comments)    "terrible, awful side effects"   Zyprexa [Olanzapine] Other (See Comments)    "terrible, awful side effects"    ROS As per HPI  PE: Vitals with BMI 12/16/2020 06/13/2020 12/10/2019  Height 5' 10.47" 5' 10.472" 5' 11.5"  Weight 208 lbs 6 oz 211 lbs 3 oz 208 lbs 3 oz  BMI 29.5 29.9 28.64  Systolic 104 112 95  Diastolic 70 67 59  Pulse 77 59 58    Wt Readings from Last 2 Encounters:  12/16/20 208 lb 6.4 oz (94.5 kg)  06/13/20 211 lb 3.2 oz (95.8 kg)    Gen: alert, oriented x 4, affect pleasant.  Lucid thinking and conversation noted. HEENT: PERRLA, EOMI.   Neck: no LAD, mass, or thyromegaly. CV: RRR, no m/r/g LUNGS: CTA bilat, nonlabored. NEURO: no tremor or tics noted on observation.  Coordination intact. CN 2-12 grossly intact bilaterally, strength 5/5 in all extremeties.  No ataxia.   LABS:  Lab Results  Component Value Date   TSH 2.54 03/31/2017   Lab Results  Component Value Date   WBC 6.8 09/26/2019   HGB 15.0 09/26/2019   HCT 44.9 09/26/2019   MCV 86.7 09/26/2019   PLT 252.0 09/26/2019   Lab Results  Component Value Date   CREATININE 1.19 06/13/2020   BUN 18 06/13/2020   NA 140 06/13/2020   K 4.4 06/13/2020   CL 105 06/13/2020   CO2 28 06/13/2020   Lab Results  Component Value Date   ALT 26 06/13/2020   AST 27 06/13/2020   ALKPHOS 58 06/13/2020   BILITOT 0.4 06/13/2020   Lab Results  Component Value Date   CHOL 166 06/13/2020   Lab Results  Component Value Date   HDL 36.90 (L) 06/13/2020   Lab Results  Component Value Date   LDLCALC 95 06/13/2020   Lab Results  Component Value Date   TRIG 171.0 (H) 06/13/2020   Lab Results  Component Value Date   CHOLHDL 4 06/13/2020   Lab Results  Component Value Date   PSA 1.03 09/26/2019   IMPRESSION AND PLAN:  1) Bipolar II: stable. Cont lamictal 100 bid and  fluox 20mg  tid.  2) Insomnia:doing well on nightly ambien and trazodone. RF'd today.  3) Migraine syndrome: stable with prn excedrin and/or maxalt for abortive tx.  4) BPH with LUT obst sx's: proscar helps moderately well and he'll continue this.  5) Prostate ca screening: last PSA was 09/2019. Rpt PSA today.  An After Visit Summary was printed and given to the patient.  FOLLOW UP: Return for no follow up->pt moving  from the area.  Signed:  Santiago Bumpers, MD           12/16/2020  ADDENDUM: pt had to wait for his vaccine and labs today and was upset. He ended up walking out w/out vaccine or lab draw.  Staff did apologize to him. Signed:  Santiago Bumpers, MD           12/16/2020

## 2020-12-16 NOTE — Patient Instructions (Signed)
Best of luck in your new home!

## 2021-09-13 IMAGING — US US RENAL
1 series · 14 of 25 positions shown · non-contrast
Comparison: None

CLINICAL DATA: Bladder outflow obstruction, chronic, question
hydronephrosis

EXAM:
RENAL / URINARY TRACT ULTRASOUND COMPLETE

[Series 1: us renal · 0.24mm/px · 14 of 30 slices shown]
[im 1/30]
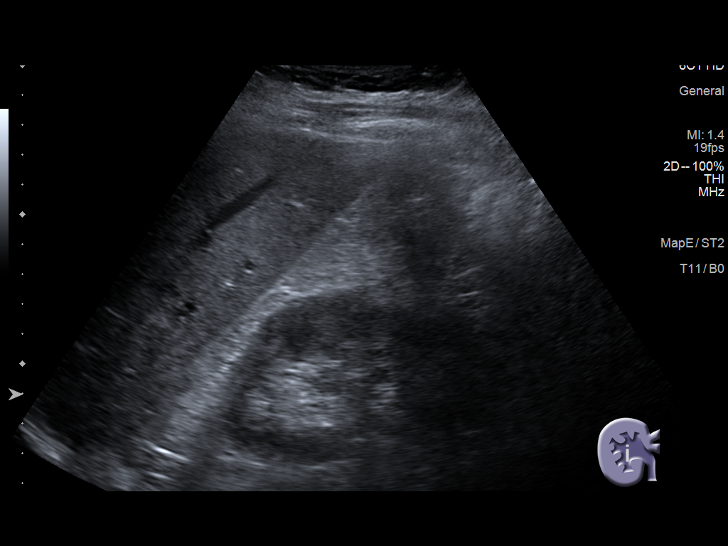
[im 3/30]
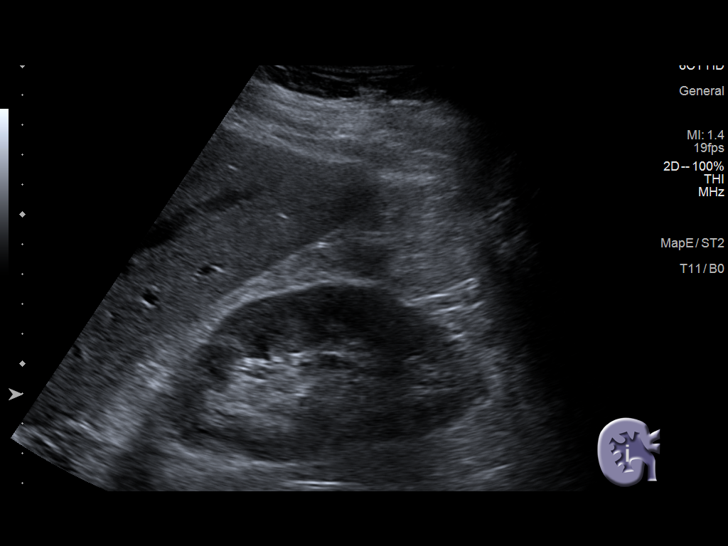
[im 5/30]
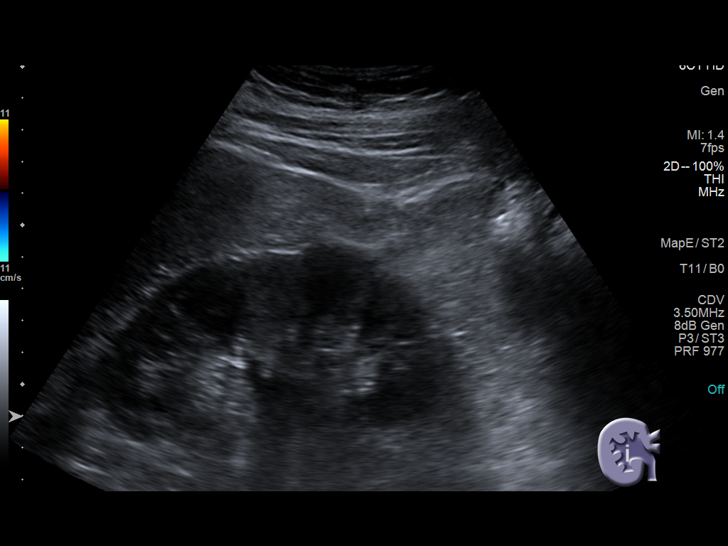
[im 8/30]
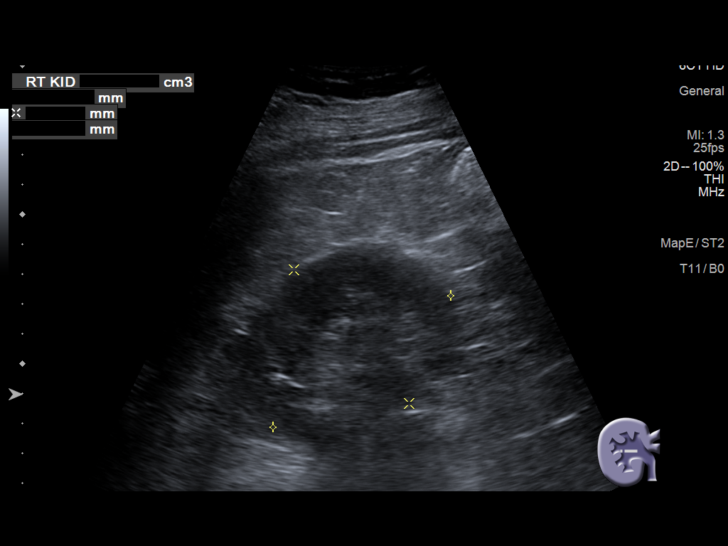
[im 10/30]
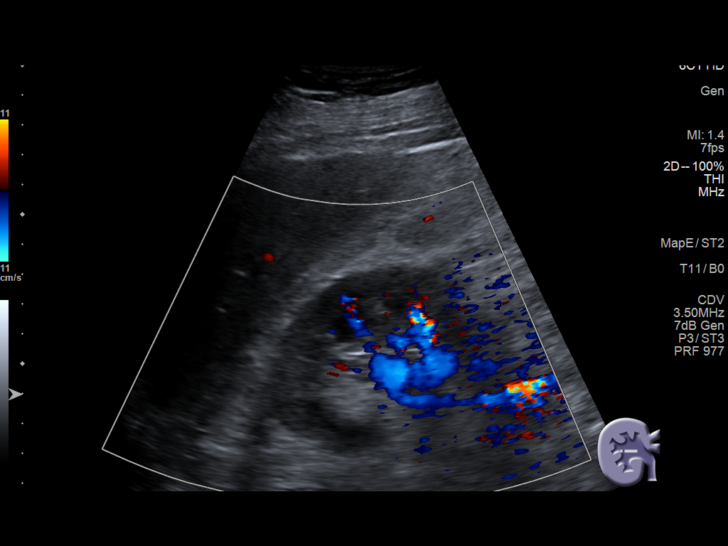
[im 11/30]
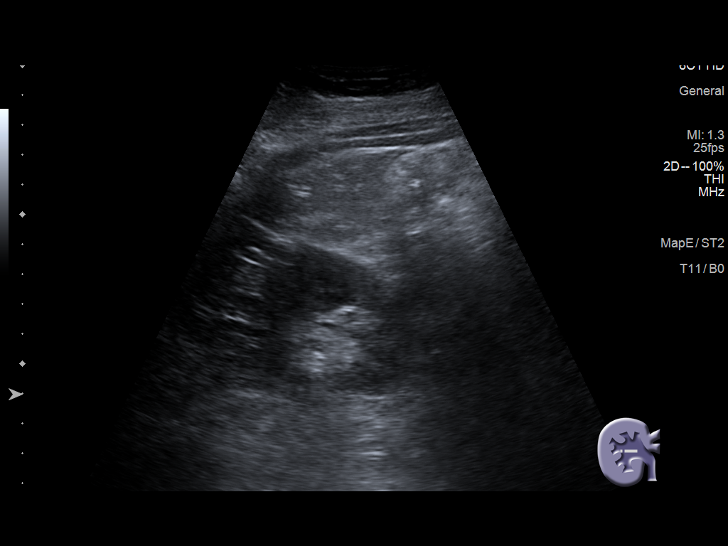
[im 14/30]
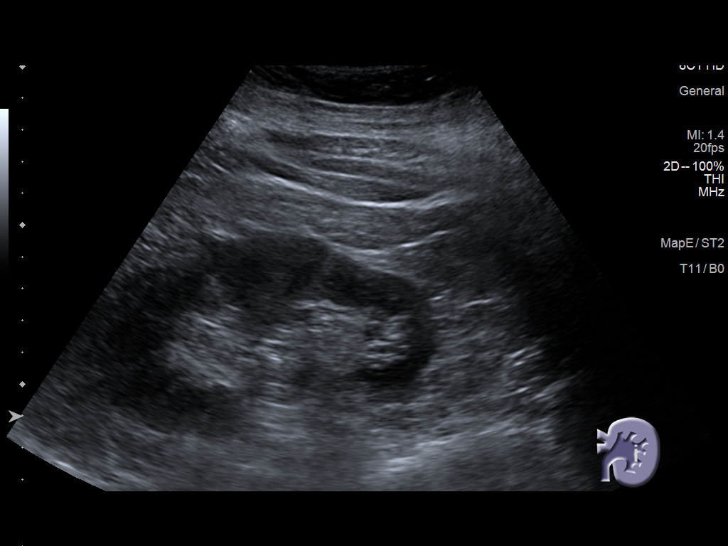
[im 16/30]
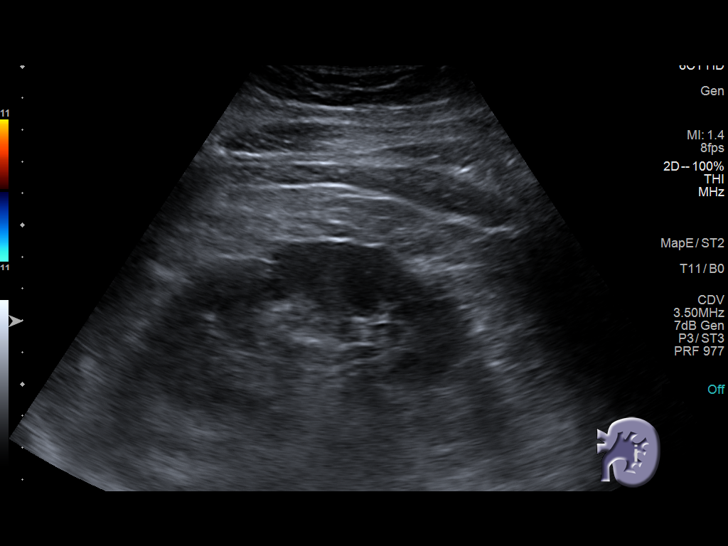
[im 19/30]
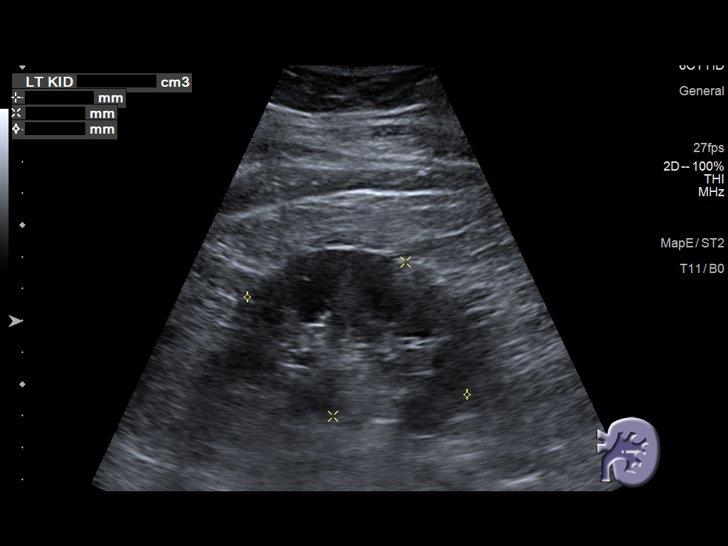
[im 20/30]
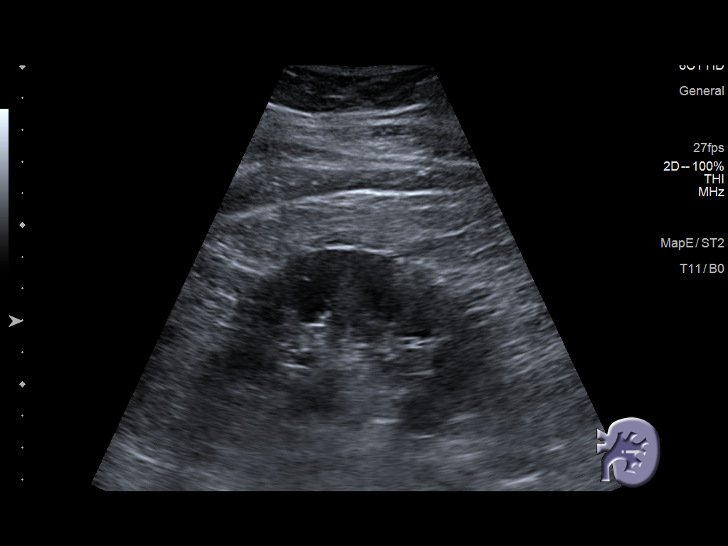
[im 22/30]
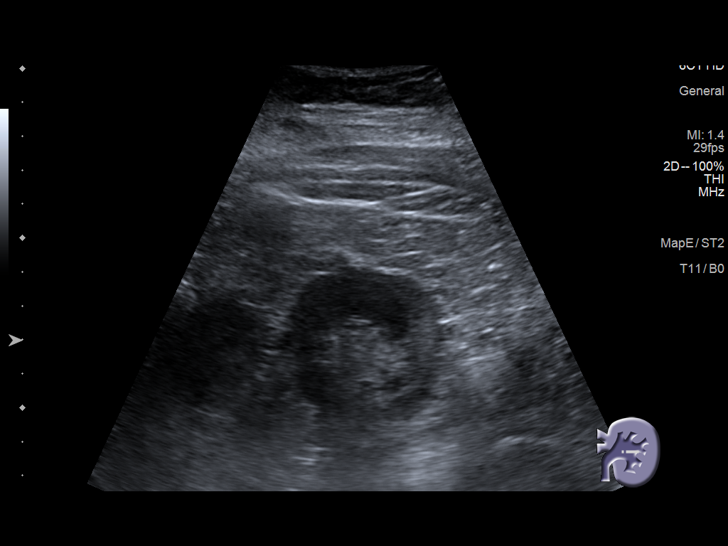
[im 25/30]
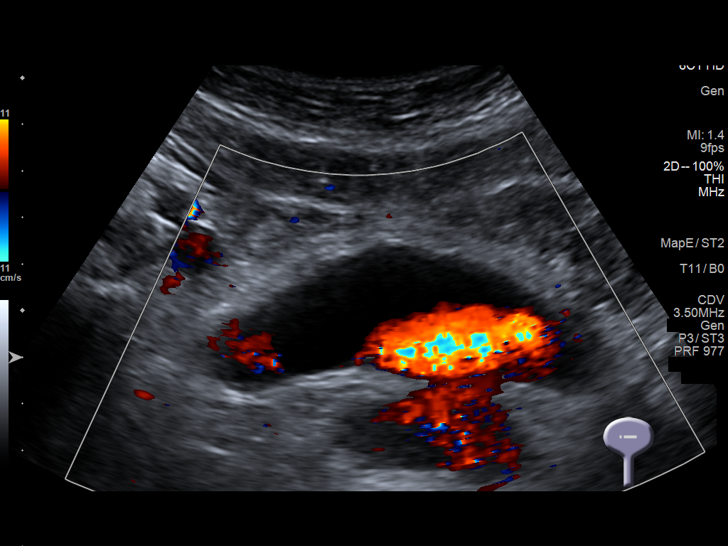
[im 27/30]
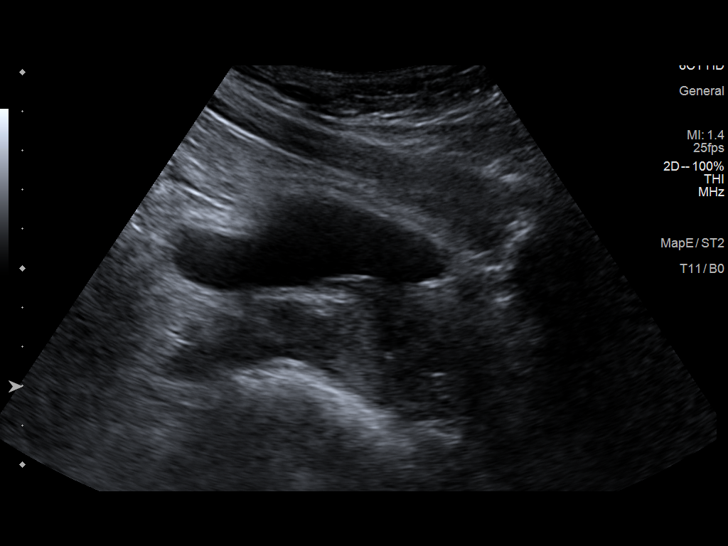
[im 30/30]
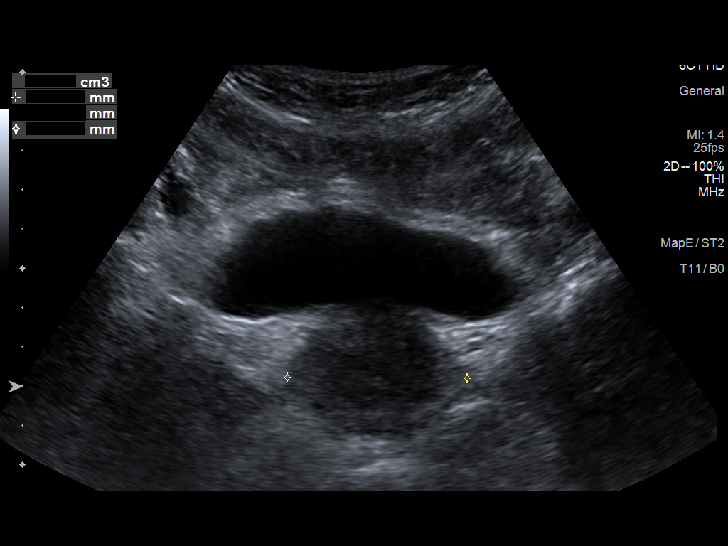

[14 of 25 positions shown; findings below may reference images not displayed]

FINDINGS: Right Kidney:

Renal measurements: 10.8 x 5.9 x 7.4 cm = volume: 246 mL. Normal
cortical thickness and echogenicity. No mass, hydronephrosis or
shadowing calcification.

Left Kidney:

Renal measurements: 10.7 x 5.4 x 7.6 cm = volume: 227 mL. Normal
cortical thickness and echogenicity. No mass, hydronephrosis or
shadowing calcification.

Bladder:

Appears normal for degree of bladder distention. BILATERAL ureteral
jets visualized.

Other:

Minimal prostatic enlargement.
IMPRESSION: Normal sonographic appearance of the kidneys and bladder.
